# Patient Record
Sex: Female | Born: 1960 | Hispanic: Yes | Smoking: Former smoker
Health system: Southern US, Community
[De-identification: ages and names within clinical notes are randomized; demographics above are authoritative.]

## PROBLEM LIST (undated history)

## (undated) DIAGNOSIS — K219 Gastro-esophageal reflux disease without esophagitis: Secondary | ICD-10-CM

## (undated) DIAGNOSIS — B009 Herpesviral infection, unspecified: Secondary | ICD-10-CM

## (undated) DIAGNOSIS — M1990 Unspecified osteoarthritis, unspecified site: Secondary | ICD-10-CM

## (undated) DIAGNOSIS — K5790 Diverticulosis of intestine, part unspecified, without perforation or abscess without bleeding: Secondary | ICD-10-CM

## (undated) DIAGNOSIS — E039 Hypothyroidism, unspecified: Secondary | ICD-10-CM

## (undated) DIAGNOSIS — K449 Diaphragmatic hernia without obstruction or gangrene: Secondary | ICD-10-CM

## (undated) DIAGNOSIS — M419 Scoliosis, unspecified: Secondary | ICD-10-CM

## (undated) DIAGNOSIS — F419 Anxiety disorder, unspecified: Secondary | ICD-10-CM

## (undated) DIAGNOSIS — G4700 Insomnia, unspecified: Secondary | ICD-10-CM

## (undated) DIAGNOSIS — E079 Disorder of thyroid, unspecified: Secondary | ICD-10-CM

## (undated) HISTORY — DX: Diverticulosis of intestine, part unspecified, without perforation or abscess without bleeding: K57.90

## (undated) HISTORY — PX: CHOLECYSTECTOMY: SHX55

## (undated) HISTORY — DX: Hypothyroidism, unspecified: E03.9

## (undated) HISTORY — PX: COLONOSCOPY: SHX174

## (undated) HISTORY — PX: APPENDECTOMY: SHX54

## (undated) HISTORY — PX: OVARIAN CYST REMOVAL: SHX89

## (undated) HISTORY — DX: Herpesviral infection, unspecified: B00.9

## (undated) HISTORY — PX: GALLBLADDER SURGERY: SHX652

## (undated) HISTORY — PX: TONSILLECTOMY: SUR1361

## (undated) HISTORY — DX: Anxiety disorder, unspecified: F41.9

## (undated) HISTORY — DX: Diaphragmatic hernia without obstruction or gangrene: K44.9

## (undated) HISTORY — DX: Unspecified osteoarthritis, unspecified site: M19.90

## (undated) HISTORY — DX: Insomnia, unspecified: G47.00

## (undated) HISTORY — PX: DILATION AND CURETTAGE OF UTERUS: SHX78

## (undated) HISTORY — DX: Scoliosis, unspecified: M41.9

## (undated) HISTORY — PX: AUGMENTATION MAMMAPLASTY: SUR837

---

## 1999-02-24 ENCOUNTER — Other Ambulatory Visit: Admission: RE | Admit: 1999-02-24 | Discharge: 1999-02-24 | Payer: Self-pay | Admitting: *Deleted

## 1999-03-11 ENCOUNTER — Encounter: Payer: Self-pay | Admitting: Internal Medicine

## 1999-03-11 ENCOUNTER — Encounter: Admission: RE | Admit: 1999-03-11 | Discharge: 1999-03-11 | Payer: Self-pay | Admitting: Internal Medicine

## 1999-07-13 ENCOUNTER — Ambulatory Visit (HOSPITAL_COMMUNITY): Admission: RE | Admit: 1999-07-13 | Discharge: 1999-07-13 | Payer: Self-pay | Admitting: Internal Medicine

## 1999-07-13 ENCOUNTER — Encounter: Payer: Self-pay | Admitting: Internal Medicine

## 1999-07-21 ENCOUNTER — Encounter: Payer: Self-pay | Admitting: Internal Medicine

## 1999-07-21 ENCOUNTER — Ambulatory Visit (HOSPITAL_COMMUNITY): Admission: RE | Admit: 1999-07-21 | Discharge: 1999-07-21 | Payer: Self-pay | Admitting: Internal Medicine

## 1999-11-20 ENCOUNTER — Other Ambulatory Visit: Admission: RE | Admit: 1999-11-20 | Discharge: 1999-11-20 | Payer: Self-pay | Admitting: *Deleted

## 1999-11-20 ENCOUNTER — Encounter (INDEPENDENT_AMBULATORY_CARE_PROVIDER_SITE_OTHER): Payer: Self-pay | Admitting: Specialist

## 2000-03-30 ENCOUNTER — Encounter: Admission: RE | Admit: 2000-03-30 | Discharge: 2000-03-30 | Payer: Self-pay | Admitting: *Deleted

## 2000-03-30 ENCOUNTER — Encounter: Payer: Self-pay | Admitting: *Deleted

## 2000-08-30 ENCOUNTER — Other Ambulatory Visit: Admission: RE | Admit: 2000-08-30 | Discharge: 2000-08-30 | Payer: Self-pay | Admitting: *Deleted

## 2001-01-10 ENCOUNTER — Encounter: Payer: Self-pay | Admitting: Nurse Practitioner

## 2001-05-08 ENCOUNTER — Other Ambulatory Visit: Admission: RE | Admit: 2001-05-08 | Discharge: 2001-05-08 | Payer: Self-pay | Admitting: *Deleted

## 2001-08-25 ENCOUNTER — Encounter: Admission: RE | Admit: 2001-08-25 | Discharge: 2001-08-25 | Payer: Self-pay | Admitting: *Deleted

## 2001-08-25 ENCOUNTER — Encounter: Payer: Self-pay | Admitting: *Deleted

## 2001-09-28 ENCOUNTER — Other Ambulatory Visit: Admission: RE | Admit: 2001-09-28 | Discharge: 2001-09-28 | Payer: Self-pay | Admitting: *Deleted

## 2001-11-07 ENCOUNTER — Encounter: Payer: Self-pay | Admitting: Gastroenterology

## 2001-11-07 ENCOUNTER — Ambulatory Visit (HOSPITAL_COMMUNITY): Admission: RE | Admit: 2001-11-07 | Discharge: 2001-11-07 | Payer: Self-pay | Admitting: Gastroenterology

## 2002-01-08 ENCOUNTER — Encounter: Payer: Self-pay | Admitting: Gastroenterology

## 2002-01-08 ENCOUNTER — Ambulatory Visit (HOSPITAL_COMMUNITY): Admission: RE | Admit: 2002-01-08 | Discharge: 2002-01-08 | Payer: Self-pay | Admitting: Gastroenterology

## 2002-02-23 ENCOUNTER — Encounter: Payer: Self-pay | Admitting: General Surgery

## 2002-02-23 ENCOUNTER — Observation Stay (HOSPITAL_COMMUNITY): Admission: RE | Admit: 2002-02-23 | Discharge: 2002-02-24 | Payer: Self-pay | Admitting: General Surgery

## 2002-02-23 ENCOUNTER — Encounter (INDEPENDENT_AMBULATORY_CARE_PROVIDER_SITE_OTHER): Payer: Self-pay | Admitting: Specialist

## 2002-09-03 ENCOUNTER — Encounter: Payer: Self-pay | Admitting: *Deleted

## 2002-09-03 ENCOUNTER — Encounter: Admission: RE | Admit: 2002-09-03 | Discharge: 2002-09-03 | Payer: Self-pay | Admitting: *Deleted

## 2002-11-07 ENCOUNTER — Other Ambulatory Visit: Admission: RE | Admit: 2002-11-07 | Discharge: 2002-11-07 | Payer: Self-pay | Admitting: *Deleted

## 2003-10-21 ENCOUNTER — Encounter: Admission: RE | Admit: 2003-10-21 | Discharge: 2003-10-21 | Payer: Self-pay | Admitting: Internal Medicine

## 2003-12-02 ENCOUNTER — Ambulatory Visit: Payer: Self-pay | Admitting: Internal Medicine

## 2003-12-27 ENCOUNTER — Ambulatory Visit: Payer: Self-pay | Admitting: Internal Medicine

## 2004-01-14 ENCOUNTER — Other Ambulatory Visit: Admission: RE | Admit: 2004-01-14 | Discharge: 2004-01-14 | Payer: Self-pay | Admitting: *Deleted

## 2004-04-02 ENCOUNTER — Ambulatory Visit: Payer: Self-pay | Admitting: Internal Medicine

## 2004-04-17 ENCOUNTER — Ambulatory Visit: Payer: Self-pay | Admitting: Internal Medicine

## 2004-04-18 ENCOUNTER — Ambulatory Visit: Payer: Self-pay | Admitting: Family Medicine

## 2004-04-20 ENCOUNTER — Ambulatory Visit: Payer: Self-pay | Admitting: Internal Medicine

## 2004-05-13 ENCOUNTER — Ambulatory Visit: Payer: Self-pay | Admitting: Gastroenterology

## 2004-10-07 ENCOUNTER — Emergency Department (HOSPITAL_COMMUNITY): Admission: EM | Admit: 2004-10-07 | Discharge: 2004-10-08 | Payer: Self-pay | Admitting: Emergency Medicine

## 2004-10-22 ENCOUNTER — Ambulatory Visit (HOSPITAL_COMMUNITY): Admission: RE | Admit: 2004-10-22 | Discharge: 2004-10-22 | Payer: Self-pay | Admitting: Internal Medicine

## 2004-11-05 ENCOUNTER — Encounter: Admission: RE | Admit: 2004-11-05 | Discharge: 2004-11-05 | Payer: Self-pay | Admitting: *Deleted

## 2005-01-13 ENCOUNTER — Other Ambulatory Visit: Admission: RE | Admit: 2005-01-13 | Discharge: 2005-01-13 | Payer: Self-pay | Admitting: *Deleted

## 2005-09-03 ENCOUNTER — Ambulatory Visit: Payer: Self-pay | Admitting: Family Medicine

## 2005-09-03 ENCOUNTER — Encounter: Admission: RE | Admit: 2005-09-03 | Discharge: 2005-09-03 | Payer: Self-pay | Admitting: Family Medicine

## 2005-10-07 ENCOUNTER — Encounter: Admission: RE | Admit: 2005-10-07 | Discharge: 2005-10-07 | Payer: Self-pay | Admitting: Internal Medicine

## 2005-11-16 ENCOUNTER — Encounter: Admission: RE | Admit: 2005-11-16 | Discharge: 2005-11-16 | Payer: Self-pay | Admitting: Family Medicine

## 2006-01-20 ENCOUNTER — Other Ambulatory Visit: Admission: RE | Admit: 2006-01-20 | Discharge: 2006-01-20 | Payer: Self-pay | Admitting: *Deleted

## 2006-11-24 ENCOUNTER — Encounter: Admission: RE | Admit: 2006-11-24 | Discharge: 2006-11-24 | Payer: Self-pay | Admitting: Family Medicine

## 2007-01-26 HISTORY — PX: BREAST ENHANCEMENT SURGERY: SHX7

## 2007-01-26 HISTORY — PX: AUGMENTATION MAMMAPLASTY: SUR837

## 2007-01-26 HISTORY — PX: OTHER SURGICAL HISTORY: SHX169

## 2007-05-02 ENCOUNTER — Other Ambulatory Visit: Admission: RE | Admit: 2007-05-02 | Discharge: 2007-05-02 | Payer: Self-pay | Admitting: *Deleted

## 2007-06-08 ENCOUNTER — Ambulatory Visit: Payer: Self-pay | Admitting: Internal Medicine

## 2008-01-17 ENCOUNTER — Ambulatory Visit: Payer: Self-pay | Admitting: Gynecology

## 2008-01-17 ENCOUNTER — Other Ambulatory Visit: Admission: RE | Admit: 2008-01-17 | Discharge: 2008-01-17 | Payer: Self-pay | Admitting: Gynecology

## 2008-04-10 ENCOUNTER — Ambulatory Visit: Payer: Self-pay | Admitting: Gynecology

## 2008-04-17 ENCOUNTER — Ambulatory Visit: Payer: Self-pay | Admitting: Gynecology

## 2008-06-26 ENCOUNTER — Ambulatory Visit: Payer: Self-pay | Admitting: Gynecology

## 2008-09-12 ENCOUNTER — Ambulatory Visit: Payer: Self-pay | Admitting: Gynecology

## 2008-11-06 ENCOUNTER — Ambulatory Visit: Payer: Self-pay | Admitting: Gynecology

## 2008-11-13 ENCOUNTER — Encounter: Admission: RE | Admit: 2008-11-13 | Discharge: 2008-11-13 | Payer: Self-pay | Admitting: Gynecology

## 2009-01-05 ENCOUNTER — Ambulatory Visit: Admission: AD | Admit: 2009-01-05 | Discharge: 2009-01-05 | Payer: Self-pay | Admitting: Family Medicine

## 2009-01-28 ENCOUNTER — Other Ambulatory Visit: Admission: RE | Admit: 2009-01-28 | Discharge: 2009-01-28 | Payer: Self-pay | Admitting: Gynecology

## 2009-01-28 ENCOUNTER — Ambulatory Visit: Payer: Self-pay | Admitting: Gynecology

## 2009-12-17 ENCOUNTER — Ambulatory Visit: Payer: Self-pay | Admitting: Gynecology

## 2010-01-02 ENCOUNTER — Encounter
Admission: RE | Admit: 2010-01-02 | Discharge: 2010-01-02 | Payer: Self-pay | Source: Home / Self Care | Attending: Gynecology | Admitting: Gynecology

## 2010-01-16 ENCOUNTER — Encounter (INDEPENDENT_AMBULATORY_CARE_PROVIDER_SITE_OTHER): Payer: Self-pay | Admitting: *Deleted

## 2010-02-10 ENCOUNTER — Other Ambulatory Visit: Payer: Self-pay | Admitting: Nurse Practitioner

## 2010-02-10 ENCOUNTER — Ambulatory Visit
Admission: RE | Admit: 2010-02-10 | Discharge: 2010-02-10 | Payer: Self-pay | Source: Home / Self Care | Attending: Internal Medicine | Admitting: Internal Medicine

## 2010-02-10 ENCOUNTER — Telehealth: Payer: Self-pay | Admitting: Gastroenterology

## 2010-02-10 DIAGNOSIS — R112 Nausea with vomiting, unspecified: Secondary | ICD-10-CM | POA: Insufficient documentation

## 2010-02-10 DIAGNOSIS — R142 Eructation: Secondary | ICD-10-CM

## 2010-02-10 DIAGNOSIS — R1013 Epigastric pain: Secondary | ICD-10-CM | POA: Insufficient documentation

## 2010-02-10 DIAGNOSIS — K219 Gastro-esophageal reflux disease without esophagitis: Secondary | ICD-10-CM | POA: Insufficient documentation

## 2010-02-10 DIAGNOSIS — R141 Gas pain: Secondary | ICD-10-CM | POA: Insufficient documentation

## 2010-02-10 DIAGNOSIS — R197 Diarrhea, unspecified: Secondary | ICD-10-CM | POA: Insufficient documentation

## 2010-02-10 DIAGNOSIS — R143 Flatulence: Secondary | ICD-10-CM

## 2010-02-10 DIAGNOSIS — R1084 Generalized abdominal pain: Secondary | ICD-10-CM | POA: Insufficient documentation

## 2010-02-10 LAB — COMPREHENSIVE METABOLIC PANEL
ALT: 17 U/L (ref 0–35)
AST: 19 U/L (ref 0–37)
Albumin: 3.5 g/dL (ref 3.5–5.2)
Alkaline Phosphatase: 50 U/L (ref 39–117)
BUN: 10 mg/dL (ref 6–23)
CO2: 28 mEq/L (ref 19–32)
Calcium: 8.7 mg/dL (ref 8.4–10.5)
Chloride: 103 mEq/L (ref 96–112)
Creatinine, Ser: 0.8 mg/dL (ref 0.4–1.2)
GFR: 85.73 mL/min (ref >60.00)
Glucose, Bld: 86 mg/dL (ref 70–99)
Potassium: 4.3 mEq/L (ref 3.5–5.1)
Sodium: 138 mEq/L (ref 135–145)
Total Bilirubin: 0.4 mg/dL (ref 0.3–1.2)
Total Protein: 6.4 g/dL (ref 6.0–8.3)

## 2010-02-10 LAB — CBC WITH DIFFERENTIAL/PLATELET
Basophils Absolute: 0 10*3/uL (ref 0.0–0.1)
Basophils Relative: 0.4 % (ref 0.0–3.0)
Eosinophils Absolute: 0.1 10*3/uL (ref 0.0–0.7)
Eosinophils Relative: 1.8 % (ref 0.0–5.0)
HCT: 37.7 % (ref 36.0–46.0)
Hemoglobin: 12.9 g/dL (ref 12.0–15.0)
Lymphocytes Relative: 30.8 % (ref 12.0–46.0)
Lymphs Abs: 1.8 10*3/uL (ref 0.7–4.0)
MCHC: 34.2 g/dL (ref 30.0–36.0)
MCV: 95.4 fl (ref 78.0–100.0)
Monocytes Absolute: 0.4 10*3/uL (ref 0.1–1.0)
Monocytes Relative: 6.5 % (ref 3.0–12.0)
Neutro Abs: 3.4 10*3/uL (ref 1.4–7.7)
Neutrophils Relative %: 60.5 % (ref 43.0–77.0)
Platelets: 351 10*3/uL (ref 150.0–400.0)
RBC: 3.95 Mil/uL (ref 3.87–5.11)
RDW: 13.9 % (ref 11.5–14.6)
WBC: 5.7 10*3/uL (ref 4.5–10.5)

## 2010-02-10 LAB — LIPASE: Lipase: 16 U/L (ref 11.0–59.0)

## 2010-02-12 ENCOUNTER — Encounter: Payer: Self-pay | Admitting: Nurse Practitioner

## 2010-02-14 ENCOUNTER — Encounter: Payer: Self-pay | Admitting: Gynecology

## 2010-02-14 ENCOUNTER — Encounter: Payer: Self-pay | Admitting: Internal Medicine

## 2010-02-26 NOTE — Assessment & Plan Note (Addendum)
Summary: np/ abdominal pain, diarrhea x 2 days   History of Present Illness Visit Type: Initial Visit Primary GI MD: Stan Head MD [facility] Primary Provider: Leana Roe Chief Complaint: abdominal pain History of Present Illness:   Patient was last seen in 2002 for a colonoscopy by Dr. Corinda Gubler which was done for hematochezia and family history of colon cancer.   Since having cholectystectomy several months ago patient has had excessive gas, noisy stomach and intermittent loose stool. She has an even longer history ot heartburn. She takes Tums several times a week.   Yesterday and today has had diffuse abdominal pain, nausea, vomiting and diarrhea. While she has loose stools sometimes, this has been non-stop. Pain woke her up last night. Defecation doesn't consistently improve the pain. No fever. No rectal bleeding. 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 Held down crackers and water today. Taking Pepto Bismul.  Drinks ETOH on weekends, three or more drinks each day. Pain started Sunday after she had drank ETOH on Saturday.    GI Review of Systems    Reports abdominal pain, acid reflux, bloating, heartburn, nausea, and  vomiting.      Denies belching, chest pain, dysphagia with liquids, dysphagia with solids, loss of appetite, vomiting blood, weight loss, and  weight gain.      Reports change in bowel habits, diarrhea, and  fecal incontinence.     Denies anal fissure, black tarry stools, constipation, diverticulosis, heme positive stool, hemorrhoids, irritable bowel syndrome, jaundice, light color stool, liver problems, rectal bleeding, and  rectal pain.    Current Medications (verified): 1)  Lorazepam 2 Mg Tabs (Lorazepam) .... 1 By Mouth At Bedtime 2)  Gas Relief 80 Mg Chew (Simethicone) .... As Needed 3)  Pepto-Bismol 262 Mg Chew (Bismuth Subsalicylate) .... As Needed  Allergies (verified): 1)  ! Sulfa 2)  !  Codeine  Past History:  Past Medical History: Anxiety Disorder  Past Surgical History: breast implants tummy tuck liposuction Appendectomy Cholecystectomy  Family History: Family History of Breast Cancer: Family History of Colon Polyps: Family History of Stomach Cancer:  Social History: divorced montessori teacher Alcohol Use - yes 6 PER DAY Daily Caffeine Use 2 children  Review of Systems       The patient complains of anxiety-new, night sweats, and urine leakage.  The patient denies allergy/sinus, anemia, arthritis/joint pain, back pain, blood in urine, breast changes/lumps, change in vision, confusion, cough, coughing up blood, depression-new, fainting, fatigue, fever, headaches-new, hearing problems, heart murmur, heart rhythm changes, itching, menstrual pain, muscle pains/cramps, nosebleeds, pregnancy symptoms, shortness of breath, skin rash, sleeping problems, sore throat, swelling of feet/legs, swollen lymph glands, thirst - excessive , urination - excessive , urination changes/pain, vision changes, and voice change.    Vital Signs:  Patient profile:   49 year old female Height:      63 inches Weight:      126 pounds BMI:     22 .40 BSA:     1.59 Pulse rate:   80 / minute Pulse rhythm:   regular BP sitting:   102 / 72  (left arm)  Vitals Entered By: Merri Ray CMA Duncan Dull) (February 10, 2010 1:38 PM)  Physical Exam  General:  Well developed, well nourished, no acute distress. Head:  Normocephalic and atraumatic. Eyes:  Conjunctiva pink, no icterus.  Neck:  no obvious masses  Lungs:  Clear throughout to auscultation. Heart:  Regular  rate and rhythm; no murmurs, rubs,  or bruits. Abdomen:  Abdomen soft, nondistended. Mild-moderate mid upper abdominal tenderness.  No obvious masses or hepatomegaly.Normal bowel sounds.  Msk:  Symmetrical with no gross deformities. Normal posture. Extremities:  No clubbing, cyanosis, edema or deformities noted. Neurologic:   Alert and  oriented x4;  grossly normal neurologically. Skin:  Intact without significant lesions or rashes. Cervical Nodes:  No significant cervical adenopathy. Psych:  Alert and cooperative. Normal mood and affect.   Impression & Recommendations:  Problem # 1:  ABDOMINAL PAIN -GENERALIZED (ICD-789.07) Assessment New Diffuse abdominal pain, nausea, vomiting (non-bloody), and diarrhea for almost 48 hours. This could be an infectious process. All of her symptoms don't fit perfectly but biliary or pancreatic disease is possible.  She has mild-moderate epigastric tenderness on exam. Will obtain basic labs and stool studies.  Patient will follow up with Dr. Leone Payor in couple of weeks.  In the meantime patient will be called with test results, and for condition update. Further recommendations will then follow.   Problem # 2:  GERD (ICD-530.81) Start daily PPI, anti-reflux literature given.   Problem # 3:  FLATULENCE-GAS-BLOATING (ICD-787.3) Assessment: Unchanged Chronic gas and intermittent loose stool and "noisy stomach" since cholecystectomy 7 months ago. Symptoms likely functional but will evaluate further once acute symptoms resolve.   Other Orders: T-C diff by PCR (14782) T-Culture, Stool ([id number]) T-Fecal WBC ([id number]) TLB-CBC Platelet - w/Differential (85025-CBCD) TLB-CMP (Comprehensive Metabolic Pnl) (80053-COMP) TLB-Lipase (83690-LIPASE)  Patient Instructions: 1)  Please go to lab, basement level. 2)  We have given you samples of Dexilant 60 mg, take 1 tab 30 min prior to breakfast.  We sent a perscription for you to your pharmacy. 3)  We have given you GERD literature, acid reflux information. 4)  Make an appointment to see Dr. Stan Head in 2-3 weeks. 5)  Copy sent to : Dr. Charlesetta Shanks 6)  The medication list was reviewed and reconciled.  All changed / newly prescribed medications were explained.  A complete medication list was provided to the patient /  caregiver. Prescriptions: PROMETHAZINE HCL 12.5 MG TABS (PROMETHAZINE HCL) Take 1 tab every 4 hours as needed for nausea  May make drowsey  #45 x 0   Entered by:   Lowry Ram NCMA   Authorized by:   Willette Cluster NP   Signed by:   Lowry Ram NCMA on 02/10/2010   Method used:   [facility] to        [facility]  [phone]retail[facility]       [address] [place], [place]  [address]       Ph: [phone] or [phone]       Fax: [phone]   RxID:   [id number]   Appended Document: np/ abdominal pain, diarrhea x 2 days Thanks. If still is much better then she can cancel her follow up and call us as needed. Probably had infectious process which has resolved. Thanks  Appended Document: np/ abdominal pain, diarrhea x 2 days Message left for patient to call back. Jesse Fall, RN 02/16/10 10:04 AM  Appended Document: np/ abdominal pain, diarrhea x 2 days Spoke with patient. She wants to keep the appointment because her "stomach makes strange noises and she has to go to the bathroom a lot for bowel movements.

## 2010-02-26 NOTE — Progress Notes (Signed)
Summary: Triage  Phone Note Call from Patient Call back at [phone]   Caller: Patient Call For: Dr. Jarold Motto Reason for Call: Talk to Nurse Summary of Call: Pt has had diarrhea and throwing up wtih a lot of pain in her stomach, started yesterday and lasted through the night, wants to be worked in today, has NP appt with Jarold Motto on 2/2 Initial call taken by: [place] Johnson,  February 10, 2010 8:05 AM  Follow-up for Phone Call        Old Dr Victorino Dike patient not seen in more than 4 years. Patient stated she knows she had a colon by Dr Doreatha Martin, doesn't remember endo. Yesterday, had diarrhea and a lot of abdominal pain; vomited 1 time. Today, same abdominal pain and diarrhea. Requested old chart. Graciella Freer RN  February 10, 2010 8:59 AM   Scheduled patient to come in to see Willette Cluster NP today. Cancelled appointment with Dr Jarold Motto for 02/26/10. Follow-up by: Graciella Freer RN,  February 10, 2010 10:19 AM

## 2010-02-26 NOTE — Procedures (Signed)
Summary: Colonoscopy/[facility]  Colonoscopy/[facility]   Imported By: Sherian Rein 02/16/2010 09:41:01  _____________________________________________________________________  External Attachment:    Type:   Image     Comment:   External Document

## 2010-02-26 NOTE — Letter (Signed)
Summary: New Patient letter  [facility]  [address]   [place], [place] [address]   Phone: [phone]  Fax: [phone]       01/16/2010 MRN: [id number]  Sarah Zhang [address] [place], [place]  [address]  Dear Ms. Sarah Zhang,  Welcome to the Gastroenterology Division at [facility].    You are scheduled to see Dr.  Jarold Motto on 02/26/2010 at 8:30 am on the 3rd floor at [facility], [address]  We ask that you try to arrive at our office 15 minutes prior to your appointment time to allow for check-in.  We would like you to complete the enclosed self-administered evaluation form prior to your visit and bring it with you on the day of your appointment.  We will review it with you.  Also, please bring a complete list of all your medications or, if you prefer, bring the medication bottles and we will list them.  Please bring your insurance card so that we may make a copy of it.  If your insurance requires a referral to see a specialist, please bring your referral form from your primary care physician.  Co-payments are due at the time of your visit and may be paid by cash, check or credit card.     Your office visit will consist of a consult with your physician (includes a physical exam), any laboratory testing he/she may order, scheduling of any necessary diagnostic testing (e.g. x-ray, ultrasound, CT-scan), and scheduling of a procedure (e.g. Endoscopy, Colonoscopy) if required.  Please allow enough time on your schedule to allow for any/all of these possibilities.    If you cannot keep your appointment, please call [phone] to cancel or reschedule prior to your appointment date.  This allows [place] the opportunity to schedule an appointment for another patient in need of care.  If you do not cancel or reschedule by 5 p.m. the business day prior to your appointment date, you will be charged a $50.00 late cancellation/no-show fee.    Thank you for choosing  [facility] for your medical needs.  We appreciate the opportunity to care for you.  Please visit [place] at our website  to learn more about our practice.                     Sincerely,                                                             The Gastroenterology Division

## 2010-03-04 ENCOUNTER — Telehealth: Payer: Self-pay | Admitting: Internal Medicine

## 2010-03-06 ENCOUNTER — Encounter: Payer: Self-pay | Admitting: Internal Medicine

## 2010-03-06 ENCOUNTER — Ambulatory Visit (INDEPENDENT_AMBULATORY_CARE_PROVIDER_SITE_OTHER): Payer: 59 | Admitting: Internal Medicine

## 2010-03-06 DIAGNOSIS — R198 Other specified symptoms and signs involving the digestive system and abdomen: Secondary | ICD-10-CM

## 2010-03-06 DIAGNOSIS — R141 Gas pain: Secondary | ICD-10-CM

## 2010-03-06 DIAGNOSIS — R1013 Epigastric pain: Secondary | ICD-10-CM

## 2010-03-06 DIAGNOSIS — J328 Other chronic sinusitis: Secondary | ICD-10-CM | POA: Insufficient documentation

## 2010-03-12 ENCOUNTER — Telehealth: Payer: Self-pay | Admitting: Internal Medicine

## 2010-03-12 NOTE — Letter (Signed)
Summary: [facility] Instructions  [facility]  [address] [place], [place] [address]   Phone: [phone]  Fax: [phone]       [place] Lonna Cobb    05/25/1970    MRN: [id number]        Procedure Day Dorna Bloom: Wednesday 03/18/10     Arrival Time: 1:30 pm     Procedure Time: 2:30 pm     Location of Procedure:                    _x _  [facility] (4th Floor)  PREPARATION FOR COLONOSCOPY WITH MOVIPREP   Starting 5 days prior to your procedure 03/14/10 do not eat nuts, seeds, popcorn, corn, beans, peas,  salads, or any raw vegetables.  Do not take any fiber supplements (e.g. Metamucil, Citrucel, and Benefiber).  THE DAY BEFORE YOUR PROCEDURE         DATE: 03/17/10  DAY: Tuesday  1.  Drink clear liquids the entire day-NO SOLID FOOD  2.  Do not drink anything colored red or purple.  Avoid juices with pulp.  No orange juice.  3.  Drink at least 64 oz. (8 glasses) of fluid/clear liquids during the day to prevent dehydration and help the prep work efficiently.  CLEAR LIQUIDS INCLUDE: Water Jello Ice Popsicles Tea (sugar ok, no milk/cream) Powdered fruit flavored drinks Coffee (sugar ok, no milk/cream) Gatorade Juice: apple, white grape, white cranberry  Lemonade Clear bullion, consomm, broth Carbonated beverages (any kind) Strained chicken noodle soup Hard Candy                             4.  In the morning, mix first dose of MoviPrep solution:    Empty 1 Pouch A and 1 Pouch B into the disposable container    Add lukewarm drinking water to the top line of the container. Mix to dissolve    Refrigerate (mixed solution should be used within 24 hrs)  5.  Begin drinking the prep at 5:00 p.m. The MoviPrep container is divided by 4 marks.   Every 15 minutes drink the solution down to the next mark (approximately 8 oz) until the full liter is complete.   6.  Follow completed prep with 16 oz of clear liquid of your choice (Nothing red or purple).   Continue to drink clear liquids until bedtime.  7.  Before going to bed, mix second dose of MoviPrep solution:    Empty 1 Pouch A and 1 Pouch B into the disposable container    Add lukewarm drinking water to the top line of the container. Mix to dissolve    Refrigerate  THE DAY OF YOUR PROCEDURE      DATE: 03/18/10 DAY: Wednesday  Beginning at 9:30 a.m. (5 hours before procedure):         1. Every 15 minutes, drink the solution down to the next mark (approx 8 oz) until the full liter is complete.  2. Follow completed prep with 16 oz. of clear liquid of your choice.    3. You may drink clear liquids until 12:30 pm (2 HOURS BEFORE PROCEDURE).   MEDICATION INSTRUCTIONS  Unless otherwise instructed, you should take regular prescription medications with a small sip of water   as early as possible the morning of your procedure.        OTHER INSTRUCTIONS  You will need a responsible adult at least 50 years  of age to accompany you and drive you home.   This person must remain in the waiting room during your procedure.  Wear loose fitting clothing that is easily removed.  Leave jewelry and other valuables at home.  However, you may wish to bring a book to read or  an iPod/MP3 player to listen to music as you wait for your procedure to start.  Remove all body piercing jewelry and leave at home.  Total time from sign-in until discharge is approximately 2-3 hours.  You should go home directly after your procedure and rest.  You can resume normal activities the  day after your procedure.  The day of your procedure you should not:   Drive   Make legal decisions   Operate machinery   Drink alcohol   Return to work  You will receive specific instructions about eating, activities and medications before you leave.    The above instructions have been reviewed and explained to me by   _______________________    I fully understand and can verbalize these instructions  _____________________________ Date _________

## 2010-03-12 NOTE — Assessment & Plan Note (Signed)
History of Present Illness Visit Type: Follow-up Visit Primary GI MD: Stan Head MD [facility] Primary Provider: Leana Roe Chief Complaint: Abdominal pain History of Present Illness:   50 yo woman  complains that her abdominal pain started on 2/8. She states that the Dexilant is helping when she gets extreme pain she took an extra Dexilant. She complains of alot of sounds coming from her stomch. She also complains of an intermitent twisting feeling in her abdomen.  Seen in January with diarrhea and nausea and vomiting which is better. Stool studies were negative. she was sick for 2 days. Then better and ok for a few days. hen she notied problems after black beans at lunch wit LUQ pain.  ++++ borborygmi now and that is creating problems for her in social situations  She had post-cholecystectomy diarrhea and took some medication after her surgey and then she stopped that. she was well without significant issues until about 1 year ago when she began to have GI problems with loose and urgent defecation and increasing borborygmi.  Frequently when  she urinates she notices that se is also producing stool. No pain with this. Stools are always formed.      GI Review of Systems    Reports abdominal pain.     Location of  Abdominal pain: LUQ.    Denies acid reflux, belching, bloating, chest pain, dysphagia with liquids, dysphagia with solids, heartburn, loss of appetite, nausea, vomiting, vomiting blood, weight loss, and  weight gain.        Denies anal fissure, black tarry stools, change in bowel habit, constipation, diarrhea, diverticulosis, fecal incontinence, heme positive stool, hemorrhoids, irritable bowel syndrome, jaundice, light color stool, liver problems, rectal bleeding, and  rectal pain. Preventive Screening-Counseling & Management      Drug Use:  no.      Current Medications (verified): 1)  Lorazepam 2 Mg Tabs (Lorazepam) .Marland Kitchen.. 1 By Mouth At Bedtime 2)  Gas Relief 80 Mg  Chew (Simethicone) .... As Needed 3)  Pepto-Bismol 262 Mg Chew (Bismuth Subsalicylate) .... As Needed 4)  Dexilant 60 Mg Cpdr (Dexlansoprazole) .... Take 1 Capsule 30 Min Prior To Breakfast  Allergies (verified): 1)  ! Sulfa 2)  ! Codeine  Past History:  Past Medical History: Reviewed history from 02/10/2010 and no changes required. Anxiety Disorder  Past Surgical History: breast implants tummy tuck liposuction Appendectomy Cholecystectomy Tonsillectomy  Family History: Family History of Breast Cancer: paternal grandmother Family History of Colon Polyps: paternal uncle Family History of Stomach Cancer:paternal uncle and paternal great uncle Family History of Colon Cancer:paternal uncle??  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 Lungs:  Clear throughout to auscultation. Heart:  Regular rate and rhythm; no murmurs, rubs,  or bruits. Abdomen:  Abdomen soft, nondistended. Mild-moderate mid upper abdominal tenderness.  No obvious masses or hepatomegaly.Normal bowel sounds.  Psych:  anxious-appearing (mildly)   Impression & Recommendations:  Problem # 1:  ABDOMINAL PAIN-EPIGASTRIC (ICD-789.06) Seems like some of this was GERD and she is better on PPI. Given overall situation with bowel changes I have some ? about possible IBD. More likely functional issues exacerbated by a gastrointestinal ifection in January. I think there  is value in endoscopy for reassurance, at least.  May need some antispasmodics. Risks, benefits,and indications of endoscopic procedure(s) were reviewed with the patient and all questions answered.  Orders: Colon/Endo (Colon/Endo)  Problem # 2:  CHANGE IN BOWELS (ZOX-096.04) Most likely a post-infectious functional eacerbation. i am suspicious of long-standing IBS with flare of problems after GI infection. At her age (almost 26) and with the symptoms and concerns, colonoscopy to look for IBD and to reassure is reasonable and she will then hopefully not need another one for 10 years unless we find something. Risks, benefits,and indications of endoscopic procedure(s) were reviewed with the patient and all questions answered.  Orders: Colon/Endo (Colon/Endo)  Problem # 3:  FLATULENCE-GAS-BLOATING (ICD-787.3) Assessment: Deteriorated Borborygmi and gas issues. Mostly borborygmi. After endoscopic evaluation will consider probiotics vs. antibiotics depending upon what we see.  Patient Instructions: 1)  You have been scheduled for an endoscopy and colonoscopy. Please follow written instructions that were given to you at your office visit today. 2)  We have given you samples of Dexilant to take 1 capsule by mouth once daily. 3)  The medication list was reviewed and reconciled.  All changed / newly prescribed medications were explained.  A complete medication list was provided to the patient / caregiver. Prescriptions: MOVIPREP 100 GM  SOLR (PEG-KCL-NACL-NASULF-NA ASC-C) As per prep instructions.  #1 x 0   Entered by:   Lamona Curl CMA (AAMA)   Authorized by:   Iva Boop MD, [facility]   Signed by:   Lamona Curl CMA (AAMA) on 03/06/2010   Method used:   [facility]        [facility]  [phone][facility] (retail)       [address] [place], [place]  [address]       Ph: [phone] or [phone]       Fax: [phone]   RxID:   [id number]  cc: Charlesetta Shanks,  MD

## 2010-03-12 NOTE — Progress Notes (Signed)
Summary: triage  Phone Note Call from Patient Call back at work [phone]   Caller: Patient Call For: Dr Leone Payor Reason for Call: Talk to Nurse Summary of Call: Patient is scheduled to see Dr Leone Payor on Friday but states that she was unable to sleep last night because she was having a lot of stomach pain, wants to know what to do. Initial call taken by: Tawni Levy,  March 04, 2010 9:46 AM  Follow-up for Phone Call        Patient is c/o abdominal pain mostly on an empty stomach.  She states the pain is waking her up at night.  She states now that she is starting to get the pain again.  She says it helped when she took a dexilant at 4:00 am.  I have asked her to add Pepcid OTC until her appointment with Dr Leone Payor on Friday.   Follow-up by: Darcey Nora RN, CGRN,  March 04, 2010 10:08 AM

## 2010-03-13 ENCOUNTER — Ambulatory Visit (INDEPENDENT_AMBULATORY_CARE_PROVIDER_SITE_OTHER): Payer: 59 | Admitting: Gynecology

## 2010-03-13 DIAGNOSIS — N898 Other specified noninflammatory disorders of vagina: Secondary | ICD-10-CM

## 2010-03-13 DIAGNOSIS — B373 Candidiasis of vulva and vagina: Secondary | ICD-10-CM

## 2010-03-18 ENCOUNTER — Encounter (AMBULATORY_SURGERY_CENTER): Payer: 59 | Admitting: Internal Medicine

## 2010-03-18 ENCOUNTER — Encounter: Payer: Self-pay | Admitting: Internal Medicine

## 2010-03-18 ENCOUNTER — Other Ambulatory Visit: Payer: Self-pay | Admitting: Internal Medicine

## 2010-03-18 DIAGNOSIS — R933 Abnormal findings on diagnostic imaging of other parts of digestive tract: Secondary | ICD-10-CM

## 2010-03-18 DIAGNOSIS — K5730 Diverticulosis of large intestine without perforation or abscess without bleeding: Secondary | ICD-10-CM

## 2010-03-18 DIAGNOSIS — R109 Unspecified abdominal pain: Secondary | ICD-10-CM

## 2010-03-18 DIAGNOSIS — R198 Other specified symptoms and signs involving the digestive system and abdomen: Secondary | ICD-10-CM

## 2010-03-18 DIAGNOSIS — R1013 Epigastric pain: Secondary | ICD-10-CM

## 2010-03-18 NOTE — Progress Notes (Signed)
Summary: Medication  Phone Note Call from Patient Call back at Home Phone [phone]   Caller: Patient Call For: Dr. Leone Payor Reason for Call: Talk to Nurse Summary of Call: Patient has questions about what medication she can take today, she has a fever blister and wants to take Valacyclovir  Initial call taken by: [place] Johnson,  March 12, 2010 9:57 AM  Follow-up for Phone Call        patient advised ok to take her fever blister meds. Follow-up by: Darcey Nora RN, CGRN,  March 12, 2010 10:10 AM

## 2010-03-20 ENCOUNTER — Telehealth: Payer: Self-pay | Admitting: Internal Medicine

## 2010-03-24 ENCOUNTER — Telehealth: Payer: Self-pay | Admitting: Internal Medicine

## 2010-03-24 NOTE — Procedures (Addendum)
Summary: Upper Endoscopy  Patient: [name] Note: All result statuses are Final unless otherwise noted.  Tests: (1) Upper Endoscopy (EGD)   EGD Upper Endoscopy       DONE     [facility]     [address]     [place], [place]  [address]          ENDOSCOPY PROCEDURE REPORT          PATIENT:  Sarah Zhang, Sarah Zhang  MR#:  [id number]     BIRTHDATE:  [birth year], 49 yrs. old  GENDER:  female          ENDOSCOPIST:  Iva Boop, MD, [facility]     Referred by:  Charlesetta Shanks, M.D.          PROCEDURE DATE:  03/18/2010     PROCEDURE:  EGD with biopsy, [address]     ASA CLASS:  Class I     INDICATIONS:  epigastric pain          MEDICATIONS:   Fentanyl 50 mcg IV, Versed 5 mg IV, Benadryl 12.5     mg IV     TOPICAL ANESTHETIC:  Exactacain Spray          DESCRIPTION OF PROCEDURE:   After the risks benefits and     alternatives of the procedure were thoroughly explained, informed     consent was obtained.  The LB GIF-H180 [id number] endoscope was     introduced through the mouth and advanced to the second portion of     the duodenum, without limitations.  The instrument was slowly     withdrawn as the mucosa was fully examined.     <<PROCEDUREIMAGES>>          Abnormal appearing mucosa in the second portion of the duodenum. ?     scalloped folds Multiple biopsies were obtained and sent to     pathology.  Otherwise the examination was normal.    Retroflexed     views revealed no abnormalities.    The scope was then withdrawn     from the patient and the procedure completed.          COMPLICATIONS:  None          ENDOSCOPIC IMPRESSION:     1) Abnormal mucosa in the second portion duodenum - ? scalloped     folds indicating celiac disease - biopsied     2) Otherwise normal examination     RECOMMENDATIONS:     1) start dicyclomine 20 mg 1/2-1 tab every 6 hours as needed for     abdominal pain - prescription sent     2) await pathology     3) colonoscopy is next          Iva Boop, MD, [facility]          CC:  Charlesetta Shanks, MD     The Patient          n.     Rosalie Doctor:   Iva Boop at 03/18/2010 03:50 PM          Alcide Clever, [phone]  Note: An exclamation mark (!) indicates a result that was not dispersed into the flowsheet. Document Creation Date: 03/18/2010 3:50 PM _______________________________________________________________________  (1) Order result status: Final Collection or observation date-time: 03/18/2010 15:24 Requested date-time:  Receipt date-time:  Reported date-time:  Referring Physician:   Ordering Physician: Stan Head [phone]) Specimen Source:  Source: Launa Grill Order  Number: [id number] Lab site:

## 2010-03-24 NOTE — Procedures (Addendum)
Summary: Colonoscopy  Patient: [name] Note: All result statuses are Final unless otherwise noted.  Tests: (1) Colonoscopy (COL)   COL Colonoscopy           DONE     [facility]     [address]     [place], [place]  [address]          COLONOSCOPY PROCEDURE REPORT          PATIENT:  Sarah, Zhang  MR#:  [id number]     BIRTHDATE:  1960-04-24, 49 yrs. old  GENDER:  female     ENDOSCOPIST:  Iva Boop, MD, [facility]     REF. BY:  Charlesetta Shanks, M.D.     PROCEDURE DATE:  03/18/2010     PROCEDURE:  Colonoscopy [address]     ASA CLASS:  Class I     INDICATIONS:  change in bowel habits     MEDICATIONS:   There was residual sedation effect present from     prior procedure., Versed 3 mg IV, Fentanyl 50 mcg IV          DESCRIPTION OF PROCEDURE:   After the risks benefits and     alternatives of the procedure were thoroughly explained, informed     consent was obtained.  Digital rectal exam was performed and     revealed no abnormalities.   The LB PCF-Q180AL [id number] endoscope     was introduced through the anus and advanced to the terminal ileum     which was intubated for a short distance, without limitations.     The quality of the prep was excellent, using MoviPrep.  The     instrument was then slowly withdrawn as the colon was fully     examined. Insertion: 5:27 minutes Withdrawal: 10:29 minutes     <<PROCEDUREIMAGES>>          FINDINGS:  Mild diverticulosis was found in the left colon.  This     was otherwise a normal examination of the colon. Includes terminal     ileum and right colon retroflexion exams.   Retroflexed views in     the rectum revealed no abnormalities.    The scope was then     withdrawn from the patient and the procedure completed.          COMPLICATIONS:  None     ENDOSCOPIC IMPRESSION:     1) Mild diverticulosis in the left colon     2) Otherwise normal examination     RECOMMENDATIONS:     1) To start dicyclomine as per EGD report.     2)  Once duodenal biopsies return will notify and consider other     therapy if needed     REPEAT EXAM:  In 10 year(s) for routine screening colonoscopy.          Iva Boop, MD, [facility]          CC:  Charlesetta Shanks, MD and The Patient          n.     eSIGNED:   Iva Boop at 03/18/2010 03:55 PM          Alcide Clever, [phone]  Note: An exclamation mark (!) indicates a result that was not dispersed into the flowsheet. Document Creation Date: 03/18/2010 3:56 PM _______________________________________________________________________  (1) Order result status: Final Collection or observation date-time: 03/18/2010 15:45 Requested date-time:  Receipt date-time:  Reported date-time:  Referring Physician:  Ordering Physician: Stan Head [phone]) Specimen Source:  Source: Launa Grill Order Number: [id number] Lab site:

## 2010-03-24 NOTE — Progress Notes (Signed)
Summary: refill of Dexilant  ---- Converted from flag ---- ---- 03/20/2010 12:04 PM, Karna Christmas wrote: Needs her Dexilant resent to pharmacy  # [phone] ------------------------------  Phone Note Call from Patient   Caller: Patient Summary of Call: Dexilant refill    Prescriptions: DEXILANT 60 MG CPDR (DEXLANSOPRAZOLE) Take 1 capsule 30 min prior to breakfast  #30 x 11   Entered by:   Milford Cage NCMA   Authorized by:   Iva Boop MD, [facility]   Signed by:   Milford Cage NCMA on 03/20/2010   Method used:   [facility] to        [facility]  [phone]* (retail)       [address] [place], [place]  [address]       Ph: [phone] or [phone]       Fax: [phone]   RxID:   [id number]

## 2010-03-24 NOTE — Miscellaneous (Signed)
Summary: dicyclomine rx  Clinical Lists Changes  Medications: Removed medication of MOVIPREP 100 GM  SOLR (PEG-KCL-NACL-NASULF-NA ASC-C) As per prep instructions. Added new medication of DICYCLOMINE HCL 20 MG TABS (DICYCLOMINE HCL) 1/2-1 tablet by mouth every 6 hours as needed for abdominal pain - Signed Rx of DICYCLOMINE HCL 20 MG TABS (DICYCLOMINE HCL) 1/2-1 tablet by mouth every 6 hours as needed for abdominal pain;  #90 x 0;  Signed;  Entered by: Iva Boop MD, [facility];  Authorized by: Iva Boop MD, [facility];  Method used: [facility]  [phone]*, [address], [place], [place]  [address], Ph: [phone] or [phone], Fax: [phone]    Prescriptions: DICYCLOMINE HCL 20 MG TABS (DICYCLOMINE HCL) 1/2-1 tablet by mouth every 6 hours as needed for abdominal pain  #90 x 0   Entered and Authorized by:   Iva Boop MD, [facility]   Signed by:   Iva Boop MD, [facility] on 03/18/2010   Method used:   Electronically to        [facility]  [phone]* (retail)       [address] [place], [place]  [address]       Ph: [phone] or [phone]       Fax: [phone]   RxID:   [id number]

## 2010-04-02 NOTE — Progress Notes (Signed)
Summary: duod bxs neg, take align  Phone Note Outgoing Call   Summary of Call: let her know duodenal biopsies were normal I suspect main issues are GERD and IBS she should take Align 1 each day for a month and continue the dicyclomine as prescribed. She was given a refill for Dexilant for 1 year but I am not convinced she will need that long-term (do not think she was on that or other PPi long-term). So would take that for a month more and see if she can stop it. Follow-up GI as needed please cc this note to Dr. Celene Skeen when complete Iva Boop MD, [facility]  March 24, 2010 6:14 AM    Follow-up for Phone Call        Left message for patient to call back Darcey Nora RN, [facility]  March 24, 2010 10:09 AM  Results reviewed with the patient and Dr Marvell Fuller recommendations.  Patient will follow up as needed.  She verblaized understanding of all instructions. Follow-up by: Darcey Nora RN, CGRN,  March 24, 2010 12:06 PM

## 2010-04-27 ENCOUNTER — Telehealth: Payer: Self-pay | Admitting: Internal Medicine

## 2010-04-27 NOTE — Telephone Encounter (Signed)
I called [employer] because that is what the pharmacy sent over for patients prior auth.   I called [employer] and spoke with Maralyn Sago and she stated that she is faxing over a form for me to fill out and fax back for prior auth and then I should hear back 24-72 hours.  I received fax, filled out and faxed back. Sent down to be scanned into patients medical record.

## 2010-04-29 NOTE — Telephone Encounter (Signed)
Left message for pt to call back  °

## 2010-04-29 NOTE — Telephone Encounter (Signed)
Pt aware of Dr. Marvell Fuller recommendations. Let pt we would work on the prior [place] for [employer].

## 2010-04-29 NOTE — Telephone Encounter (Signed)
Pt states that she is better as far as the reflux but she is still complaining of having to go to the bathroom 3-4 times a day for BM's . They are normal in consistency she states but she wants to know if there is something she can take that may decrease the number of times she goes for BM's. Also states that she is taking a [employer] brand Probiotic. Dr. Leone Payor please advise.

## 2010-04-29 NOTE — Telephone Encounter (Signed)
Start with examining diet - reduce fiber, reduce caffeine if using

## 2010-04-29 NOTE — Telephone Encounter (Signed)
I called patient back and patient stated that she is not sure if she even needs Dexilant anymore and she is still taking Dexilant with an OTC PPI and cannot remember the name of the OTC PPI but no improvement in symptoms. Patient was also complaining of abdominal pain, and frequent BMs that is not loose or diarrhea. Patient states that she feels like she is not getting better.  I told patient that I would forward message to [facility] who is handling Dr Leone Payor triage and she will call her back.

## 2010-06-03 ENCOUNTER — Telehealth: Payer: Self-pay | Admitting: *Deleted

## 2010-06-03 NOTE — Telephone Encounter (Signed)
[facility] Dexilant.Jeanene Erb [employer] because I did prior auth in April and patient never returned message that she did not receive the medication.  Called [employer] and they said the received the fax for prior auth and it was done but they don't know what happened when prior auth was scanned in.  Told [employer] we never received fax nor did pharmacy, nor a phone call stating that there was a problem.   [employer] said they are sending new prior auth and wants it filled out again.  Filled out and faxed back today.  Called patient to see if she needed samples and to explain problem with prior auth and left message. Waiting on call back from patient

## 2010-06-09 NOTE — Assessment & Plan Note (Signed)
[facility]                         ELECTROPHYSIOLOGY OFFICE NOTE   NAME:Sarah Zhang, Sarah Zhang            MRN:          [id number]  DATE:06/08/2007                            DOB:          03/04/1960    Sarah Zhang is referred today by Dr. Charlesetta Shanks for evaluation of  palpitations and increasing fatigue and tiredness.  The patient is a  very pleasant 50 year old woman who has had a 1-2 month history of  palpitations.  She notes that she has been under increasing stress, that  she is concerned about her 43 year old daughter and her decisions about  where she will live in the fall.  The patient has never had symptoms  like this in the past.  She notes that she does have trouble sleeping at  nighttime and takes benzodiazepines to help with her sleep.  The patient  denies syncope.  She denies chest pain, except that she does note she  has some chest pressure when she gets into arguments with her 18-year-  old daughter.  However, the patient exercises on a regular basis and has  no trouble with chest discomfort with vigorous physical exertion.   ADDITIONAL PAST MEDICAL HISTORY:  1. Tonsillectomy in the past.  2. She has had removal of an ovarian cyst.  3. She has a history of cholecystectomy.  4. She has a history of appendectomy in the past.   FAMILY HISTORY:  Notable for her mother dying of stomach cancer, and her  father dying of cirrhosis and alcohol abuse.   SOCIAL HISTORY:  The patient is divorced.  She denies tobacco use,  stopping smoking in 1985.  She drinks several alcoholic beverages on the  weekend.   MEDICATIONS:  1. Lorazepam 1 mg, 1-2-3 tablets at night.  2. Oral contraceptive pills.  3. Multivitamin.   REVIEW OF SYSTEMS:  Otherwise negative, except as noted in the HPI.  She  has very mild arthritis as well.   PHYSICAL EXAMINATION:  GENERAL:  She is a pleasant, well-appearing 75-  year-old woman, looking younger than her  stated age.  VITAL SIGNS:  Blood pressure was 100/70, the pulse was 77 and regular,  the respirations were 18, weight was 123 pounds.  HEENT:  Normocephalic, atraumatic.  Pupils equal and round.  The  oropharynx was moist.  The sclerae were anicteric.  NECK:  Revealed no jugular venous distention.  There was no thyromegaly.  The trachea is midline.  The carotids are 2+ and symmetric.  LUNGS:  Clear bilaterally to auscultation.  No wheezes, rales, or  rhonchi are present.  CARDIOVASCULAR:  Revealed a regular rate and rhythm, with normal S1 and  S2.  There were no murmurs, rubs, or gallops.  The PMI was not enlarged  or laterally displaced.  ABDOMEN:  Soft, nontender, nondistended.  There was no organomegaly.  The bowel sounds were present.  No rebound or guarding.  EXTREMITIES:  Demonstrated no cyanosis, clubbing, or edema.  The pulses  were 2+ and symmetric.  NEUROLOGIC:  Alert and oriented x3, with cranial nerves intact. Strength  is 5/5 and symmetric.   EKG:  Demonstrates sinus rhythm, with normal axis  and intervals.   IMPRESSION:  1. Palpitations.  2. Fatigue  3. Very, very atypical chest discomfort.   DISCUSSION:  I have reassured Sarah Zhang that I do not think that her  palpitations are anything significant and that they most likely are PACs  and PVCs.  I suspect her fatigue is related to stress and that she is  taking chronic sleeping pills, and I have given steps as to how she  might reduce her dependency on benzodiazepines for sleep.  Finally,  because her chest pain has no relationship to exertion, I have  recommended a period of watchful waiting on this as well.  If her  symptoms worsen, then having her wear a cardiac monitor for 3 weeks  would be reasonable, as would a 2D echocardiogram be reasonable as well.  We will see the patient back in several months to see how she is doing.     Doylene Canning. Ladona Ridgel, MD  Electronically Signed    GWT/MedQ  DD: 06/08/2007  DT:  06/08/2007  Job #: [id number]   cc:   Charlesetta Shanks

## 2010-06-12 NOTE — Assessment & Plan Note (Signed)
[facility]                          [place] OFFICE NOTE   Sarah Zhang, Sarah Zhang                      MRN:          [id number]  DATE:09/03/2005                            DOB:          1960/02/08    HISTORY OF PRESENT ILLNESS:  Ms. Sarah Zhang is a 50 year old Hispanic female who  is a Runner, broadcasting/film/video at [employer].  She reports that over the last 3 months she has  been having discomfort in her hips bilaterally, usually more towards the  back.  She states that the pain and stiffness occurs in the morning when she  wakes up.  After several minutes she feels much better.  She denies any  weakness or paresthesias.  She does report some mild lumbar discomfort.   The patient also reports that she has pain in her feet.  Again, her feet are  comfortable with the first step in the morning.  This usually is localized  to the soles of the feet, more on the outer part of the arch.  She also  complains of similar discomfort in her thumbs in the morning which again  resolved after several minutes.  She denies any shoulder, elbow or knee  discomfort.  She is otherwise pretty active.  She denies any significant  fatigue.  She does report that aspirin helps with the discomfort but she is  afraid to take it regularly secondary to upset stomach.   PAST MEDICAL HISTORY:  Cholecystectomy.   MEDICATIONS:  Tums p.r.n.   ALLERGIES:  No known drug allergies.   FAMILY HISTORY:  Father passed away from cirrhosis of the liver.  Mother  passed away from stomach cancer.  She has six siblings with no significant  medical problem except for the psychiatric illness.   SOCIAL HISTORY:  The patient is a professor at [employer].  She is  married with two children.  She denies any alcohol or tobacco use.   PHYSICAL EXAMINATION:  VITAL SIGNS:  Weight is 128, pulse 80, blood pressure  100/72.  GENERAL:  She is a pleasant, Hispanic female in no acute distress.  She  answers  questions appropriately.  MUSCULOSKELETAL:  Examination of the lower back is significant for no  midline tenderness.  There is no paraspinal tenderness.  Examination of the  hip is significant for no instability.  She has full range of motion.  Flexion and extension of the hip causes no discomfort.  Examination of the  feet is significant for no obvious deformity.  No tenderness elicited  with  palpation.  Pedal pulses are 2+ and equal bilaterally.  Surveillance of the  large joints including shoulder, elbow, wrist, knee and ankle were  significant for no synovitis, weakness, stiffness or tenderness.   IMPRESSION:  This 50 year old female presents with arthralgias for the last  3 months.  It may be mechanically related but she has a remote family  history of rheumatoid arthritis and thus further evaluation is warranted.   PLAN:  1. Will obtain a CBC, comprehensive metabolic profile and a sedimentation      rate and rheumatoid factor.  2.  Advised the patient that she can take Tylenol Arthritis two every 8      hours as needed.  3. I will obtain an x-ray of her hip, lumbar spine and left foot.  4. Further recommendations after review of the above.                                   Leanne Chang, MD   LA/MedQ  DD:  09/03/2005  DT:  09/04/2005  Job #:  [id number]

## 2010-06-12 NOTE — Op Note (Signed)
NAME:  Sarah Zhang, Sarah Zhang               ACCOUNT NO.:  [id number]   MEDICAL RECORD NO.:  [id number]                   PATIENT TYPE:  AMB   LOCATION:  DAY                                  FACILITY:  [facility]   PHYSICIAN:  Timothy E. Earlene Plater, M.D.              DATE OF BIRTH:  Jul 24, 1960   DATE OF PROCEDURE:  02/23/2002  DATE OF DISCHARGE:                                 OPERATIVE REPORT   PREOPERATIVE DIAGNOSIS:  Biliary dyskinesia.   POSTOPERATIVE DIAGNOSIS:  Biliary dyskinesia.   PROCEDURE:  Laparoscopic cholecystectomy and operative cholangiogram.   SURGEON:  Timothy E. Earlene Plater, M.D.   ASSISTANT:  Gita Kudo, M.D.   ANESTHESIA:  General, Dr. Sandria Manly.   CLINICAL NOTE:  The patient is an otherwise-healthy 50 year old female who  has been seen and evaluated by Casimiro Needle E. Norins, M.D., and Ulyess Mort, M.D.  Findings have been evidence for biliary dyskinesia with  depressed ejection fraction.  Gallbladder ultrasound was normal.  The  patient's history is compatible with gallbladder disease and after careful  and complete discussion, she wishes to proceed with removal of the  gallbladder.  She has identified the pertinent side and evaluated by  anesthesia.  Her normal laboratory data and EKG were reviewed.   DESCRIPTION OF PROCEDURE:  She was taken to the operating room, placed  supine, general endotracheal anesthesia administered.  The abdomen was  prepped and draped in the usual fashion.  Marcaine 0.5% with epinephrine was  used throughout for local anesthesia.  A vertical incision made beneath the  umbilicus, the fascia identified, opened vertically, and the peritoneum  entered without complication.  A Hasson catheter placed and tied there with  a mattress suture of #1 Vicryl.  The abdomen insufflated and a general  peritoneoscopy carried out, including the pelvis, and all seemed to be  normal with a slight enlargement of the uterus.  The gallbladder was normal  in  appearance.  A second 10 mm trocar placed in the midepigastrium and two 5  mm trocars in the right upper quadrant.  The gallbladder was grasped, placed  on tension.  Inspection revealed it to appear normal.  Careful dissection  was begun at the base of the gallbladder, where the peritoneum was stripped  proximally and a tiny cystic duct was identified leading to the gallbladder.  This was completely dissected out.  Behind that was a double-branched cystic  artery.  The gallbladder was clipped near the origin of the cystic duct.  An  incision was made in the cystic duct with some difficulty because of its  small size and the Reddick catheter was threaded into this tiny duct.  A  clip was placed and then using real-time fluoroscopy, cholangiography  carried out.  There was slight extravasation of dye at the insertion site,  but the biliary tree filled nicely and appearance was normal.  It flowed  well into the duodenum.   With this information, the clip  was removed and the catheter removed, the  stump of the cystic carefully doubly clipped and divided, the cystic artery  doubly clipped and divided, the posterior branch doubly clipped and divided,  and the gallbladder was removed from the gallbladder bed.  A small nick was  made in the posterior wall of the gallbladder.  There was some bile leakage.  No stones were seen.  The gallbladder was removed from the gallbladder bed.  There was some bleeding in the gallbladder bed.  This was treated with  cautery and then application of Surgicel.  It was dry.  Copious irrigation  was carried out.  The gallbladder was placed in an EndoCatch bag and removed  from the abdominal cavity.  Further inspection was made in the gallbladder  bed, and it appeared dry.  Copious irrigation was carried out with 2 L of  saline until completely clear.  With this, the procedure was complete, all  counts correct.  All CO2, irrigant, instruments, and trocars removed  under  direct vision.  The skin incisions were closed with 4-0 Monocryl and Steri-  Strips.  Final counts correct.  She tolerated it well and was awakened and  taken to the recovery room in good condition.                                               Timothy E. Earlene Plater, M.D.    TED/MEDQ  D:  02/23/2002  T:  02/23/2002  Job:  [id number]   cc:   Ulyess Mort, M.D. [facility]

## 2010-07-13 ENCOUNTER — Encounter (INDEPENDENT_AMBULATORY_CARE_PROVIDER_SITE_OTHER): Payer: BC Managed Care – PPO | Admitting: Gynecology

## 2010-07-13 ENCOUNTER — Other Ambulatory Visit: Payer: Self-pay | Admitting: Gynecology

## 2010-07-13 ENCOUNTER — Other Ambulatory Visit (HOSPITAL_COMMUNITY)
Admission: RE | Admit: 2010-07-13 | Discharge: 2010-07-13 | Disposition: A | Discharge status: Home / Self Care | Payer: BC Managed Care – PPO | Source: Ambulatory Visit | Attending: Gynecology | Admitting: Gynecology

## 2010-07-13 DIAGNOSIS — Z124 Encounter for screening for malignant neoplasm of cervix: Secondary | ICD-10-CM | POA: Insufficient documentation

## 2010-07-13 DIAGNOSIS — N951 Menopausal and female climacteric states: Secondary | ICD-10-CM

## 2010-07-13 DIAGNOSIS — B373 Candidiasis of vulva and vagina: Secondary | ICD-10-CM

## 2010-07-13 DIAGNOSIS — Z833 Family history of diabetes mellitus: Secondary | ICD-10-CM

## 2010-07-13 DIAGNOSIS — Z01419 Encounter for gynecological examination (general) (routine) without abnormal findings: Secondary | ICD-10-CM

## 2010-07-13 DIAGNOSIS — E559 Vitamin D deficiency, unspecified: Secondary | ICD-10-CM

## 2010-07-13 DIAGNOSIS — Z13220 Encounter for screening for lipoid disorders: Secondary | ICD-10-CM

## 2010-07-24 ENCOUNTER — Encounter: Payer: 59 | Admitting: Gynecology

## 2010-08-04 ENCOUNTER — Ambulatory Visit (INDEPENDENT_AMBULATORY_CARE_PROVIDER_SITE_OTHER): Payer: BC Managed Care – PPO | Admitting: Gynecology

## 2010-08-04 DIAGNOSIS — L293 Anogenital pruritus, unspecified: Secondary | ICD-10-CM

## 2010-08-04 DIAGNOSIS — N898 Other specified noninflammatory disorders of vagina: Secondary | ICD-10-CM

## 2010-08-04 DIAGNOSIS — N926 Irregular menstruation, unspecified: Secondary | ICD-10-CM

## 2010-08-04 DIAGNOSIS — B373 Candidiasis of vulva and vagina: Secondary | ICD-10-CM

## 2010-11-16 ENCOUNTER — Other Ambulatory Visit: Payer: Self-pay | Admitting: *Deleted

## 2010-11-16 MED ORDER — LORAZEPAM 2 MG PO TABS: ORAL_TABLET | ORAL | Status: DC

## 2010-11-16 NOTE — Telephone Encounter (Signed)
Rx called in to pharmacy. 

## 2010-11-20 ENCOUNTER — Telehealth: Payer: Self-pay | Admitting: *Deleted

## 2010-11-20 NOTE — Telephone Encounter (Signed)
Pt called complaining of yeast infection asking for rx. Lm for pt to call.

## 2010-12-22 ENCOUNTER — Other Ambulatory Visit: Payer: Self-pay | Admitting: Gynecology

## 2010-12-22 DIAGNOSIS — Z1231 Encounter for screening mammogram for malignant neoplasm of breast: Secondary | ICD-10-CM

## 2011-01-14 ENCOUNTER — Ambulatory Visit
Admission: RE | Admit: 2011-01-14 | Discharge: 2011-01-14 | Disposition: A | Discharge status: Home / Self Care | Payer: BC Managed Care – PPO | Source: Ambulatory Visit | Attending: Gynecology | Admitting: Gynecology

## 2011-01-14 DIAGNOSIS — Z1231 Encounter for screening mammogram for malignant neoplasm of breast: Secondary | ICD-10-CM

## 2011-01-15 ENCOUNTER — Other Ambulatory Visit: Payer: Self-pay | Admitting: Gynecology

## 2011-01-22 ENCOUNTER — Ambulatory Visit (INDEPENDENT_AMBULATORY_CARE_PROVIDER_SITE_OTHER): Payer: BC Managed Care – PPO | Admitting: Gynecology

## 2011-01-22 ENCOUNTER — Encounter: Payer: Self-pay | Admitting: Gynecology

## 2011-01-22 VITALS — BP 116/70

## 2011-01-22 DIAGNOSIS — N644 Mastodynia: Secondary | ICD-10-CM

## 2011-01-22 NOTE — Progress Notes (Signed)
Patient presents having gone to have her routine screening mammogram and said the girl that her left nipple was sore and they referred her to her gynecologist before doing the mammogram. She notes over the last several weeks that her left nipple seems to feel sore almost a burning type sensation. No skin changes or nipple discharge. On exam last night she felt a small mass at the edge of the nipple.  Exam with Elane Fritz chaperone present Both breast examined lying and sitting, bilateral implants noted Right breast without masses retractions discharge adenopathy Left breast with small pointing skin needlelike area 6:00 on areola in scar line. Otherwise breasts without masses retractions discharge adenopathy. Area feels like a small stitch.  Assessment and plan: Left breast tenderness. I think this probably is more physiologic. Small area needlelike 6:00 edge of the areola in scar line from her implant surgery feels to be a small stitch. Is not inflammatory at all and feels connected to the skin. She had her surgery done in [place] and records are not available.  Discussed with patient options to see a plastic surgeon, consider excising this area versus observation was reviewed. Patient would prefer to watch for now. I am going to order a diagnostic mammogram and ultrasound of the left breast given the tenderness issue assuming these are all negative then I think we'll continue with self breast exams, as long as this probable stitch area remains stable we'll follow and she's comfortable with this.

## 2011-01-22 NOTE — Patient Instructions (Signed)
Follow up for mammogram and ultrasound as we scheduled.

## 2011-01-25 ENCOUNTER — Other Ambulatory Visit: Payer: Self-pay | Admitting: Gynecology

## 2011-01-27 ENCOUNTER — Other Ambulatory Visit: Payer: Self-pay | Admitting: *Deleted

## 2011-01-27 ENCOUNTER — Telehealth: Payer: Self-pay | Admitting: *Deleted

## 2011-01-27 DIAGNOSIS — N644 Mastodynia: Secondary | ICD-10-CM

## 2011-01-27 NOTE — Telephone Encounter (Signed)
Patient informed appt information.

## 2011-01-27 NOTE — Progress Notes (Signed)
Addended byValeda Malm L on: 01/27/2011 10:47 AM   Modules accepted: Orders

## 2011-01-27 NOTE — Telephone Encounter (Signed)
Lm for patient to call.  Need to give appt information.  Appt set up with [facility] on 01/29/11 @ 8:40.

## 2011-01-27 NOTE — Telephone Encounter (Signed)
Message copied by Libby Maw on Wed Jan 27, 2011 10:49 AM ------      Message from: Dara Lords      Created: Fri Jan 22, 2011  5:24 PM       Schedule diagnostic mammogram and ultrasound in reference to left breast. Areolar tenderness. Small area 6:00 areola and scar from reconstruction surgery feels like small stitch.

## 2011-01-29 ENCOUNTER — Ambulatory Visit
Admission: RE | Admit: 2011-01-29 | Discharge: 2011-01-29 | Disposition: A | Discharge status: Home / Self Care | Payer: BC Managed Care – PPO | Source: Ambulatory Visit | Attending: Gynecology | Admitting: Gynecology

## 2011-01-29 DIAGNOSIS — N644 Mastodynia: Secondary | ICD-10-CM

## 2011-02-12 ENCOUNTER — Other Ambulatory Visit: Payer: Self-pay | Admitting: *Deleted

## 2011-02-12 MED ORDER — LORAZEPAM 2 MG PO TABS: ORAL_TABLET | ORAL | Status: DC

## 2011-02-12 NOTE — Telephone Encounter (Signed)
rx called in

## 2011-03-11 ENCOUNTER — Telehealth: Payer: Self-pay | Admitting: *Deleted

## 2011-03-11 MED ORDER — LORAZEPAM 2 MG PO TABS: 2.0000 mg | ORAL_TABLET | Freq: Every evening | ORAL | Status: DC | PRN

## 2011-03-11 NOTE — Telephone Encounter (Signed)
Pt called asking for refill for her Ativan 2 mg, pt said she is leaving town tomorrow and only has 4 days worth of medication. Please advise.

## 2011-03-11 NOTE — Telephone Encounter (Signed)
Left message that  rx sent to pharmacy 

## 2011-04-27 ENCOUNTER — Ambulatory Visit: Payer: BC Managed Care – PPO | Admitting: Gynecology

## 2011-07-05 ENCOUNTER — Ambulatory Visit (INDEPENDENT_AMBULATORY_CARE_PROVIDER_SITE_OTHER): Payer: BC Managed Care – PPO | Admitting: Gynecology

## 2011-07-05 ENCOUNTER — Encounter: Payer: Self-pay | Admitting: Gynecology

## 2011-07-05 DIAGNOSIS — N949 Unspecified condition associated with female genital organs and menstrual cycle: Secondary | ICD-10-CM

## 2011-07-05 DIAGNOSIS — A499 Bacterial infection, unspecified: Secondary | ICD-10-CM

## 2011-07-05 DIAGNOSIS — R102 Pelvic and perineal pain: Secondary | ICD-10-CM

## 2011-07-05 DIAGNOSIS — N898 Other specified noninflammatory disorders of vagina: Secondary | ICD-10-CM

## 2011-07-05 DIAGNOSIS — B9689 Other specified bacterial agents as the cause of diseases classified elsewhere: Secondary | ICD-10-CM

## 2011-07-05 DIAGNOSIS — G4700 Insomnia, unspecified: Secondary | ICD-10-CM

## 2011-07-05 DIAGNOSIS — N750 Cyst of Bartholin's gland: Secondary | ICD-10-CM

## 2011-07-05 DIAGNOSIS — N760 Acute vaginitis: Secondary | ICD-10-CM

## 2011-07-05 MED ORDER — TINIDAZOLE 500 MG PO TABS: 2.0000 g | ORAL_TABLET | Freq: Once | ORAL | Status: AC

## 2011-07-05 MED ORDER — FLUCONAZOLE 150 MG PO TABS: 150.0000 mg | ORAL_TABLET | Freq: Once | ORAL | Status: AC

## 2011-07-05 MED ORDER — LORAZEPAM 2 MG PO TABS: 2.0000 mg | ORAL_TABLET | Freq: Every evening | ORAL | Status: DC | PRN

## 2011-07-05 NOTE — Patient Instructions (Signed)
Take antibiotics as prescribed. Follow up if discharge continues. Observe Bartholin's cyst. If it becomes painful or enlarges follow up for exam. Observe perianal sebaceous cyst.  If it enlarges or becomes tender follow up for exam. Follow up in one month for your annual gynecologic exam.

## 2011-07-05 NOTE — Progress Notes (Signed)
Patient presents with vaginal itching and discharge of the last several weeks. Also notes a bump around her anus. She does have some lower abdominal discomfort that she notes particularly at night when she is lying there very transient last seconds sharp stabbing. No dyspareunia nausea vomiting diarrhea constipation or UTI symptoms.  Exam was Sherrilyn Rist chaperone present Abdomen soft nontender without masses guarding rebound organomegaly. Pelvic external BUS vagina with 2 cm soft nontender left Bartholin's cyst. Vagina with white discharge. Cervix normal. Uterus normal size midline mobile nontender. Adnexa without masses or tenderness. Perineum/anus with small classic sebaceous cyst overlying blackhead 3:00 perianal position  Assessment and plan: 1. White discharge. KOH wet prep suspicious for BV. We'll treat with Tindamax 2 g daily x2 doses, alcohol avoidance reviewed. We'll also cover with Diflucan 150x1 dose given her history of recurrent yeast in the past. 2. Perianal sebaceous cyst, small. Reviewed with the patient we'll plan observation as it's nontender and not bothersome to her. 3. Left Bartholin's cyst. On questioning she apparently has had this years ago which was drained. It is noninflammatory and nontender to her does not bother her. Reviewed options with her to include observation, Ward catheter, marsupialization.  We'll plan observation with reexam next month when she is due for her annual exam which I reminded her to schedule. 4. Insomnia. She asked for a refill of her lorazepam 2 mg for which he takes 1/2-1 pill nightly. She's had a long history of insomnia which this helps with. I refilled her #30 with 2 refills.

## 2011-07-06 LAB — URINALYSIS W MICROSCOPIC + REFLEX CULTURE
Bilirubin Urine: NEGATIVE
Casts: NONE SEEN
Crystals: NONE SEEN
Glucose, UA: NEGATIVE mg/dL
Leukocytes, UA: NEGATIVE
Squamous Epithelial / LPF: NONE SEEN
Urobilinogen, UA: 0.2 mg/dL (ref 0.0–1.0)

## 2011-07-08 ENCOUNTER — Other Ambulatory Visit: Payer: Self-pay | Admitting: *Deleted

## 2011-07-14 ENCOUNTER — Encounter: Payer: Self-pay | Admitting: Gynecology

## 2011-07-14 ENCOUNTER — Ambulatory Visit (INDEPENDENT_AMBULATORY_CARE_PROVIDER_SITE_OTHER): Payer: BC Managed Care – PPO | Admitting: Gynecology

## 2011-07-14 VITALS — BP 114/70 | Ht 63.0 in | Wt 116.0 lb

## 2011-07-14 DIAGNOSIS — Z01419 Encounter for gynecological examination (general) (routine) without abnormal findings: Secondary | ICD-10-CM

## 2011-07-14 DIAGNOSIS — Z13220 Encounter for screening for lipoid disorders: Secondary | ICD-10-CM

## 2011-07-14 DIAGNOSIS — N750 Cyst of Bartholin's gland: Secondary | ICD-10-CM

## 2011-07-14 DIAGNOSIS — B373 Candidiasis of vulva and vagina: Secondary | ICD-10-CM

## 2011-07-14 DIAGNOSIS — N898 Other specified noninflammatory disorders of vagina: Secondary | ICD-10-CM

## 2011-07-14 DIAGNOSIS — L293 Anogenital pruritus, unspecified: Secondary | ICD-10-CM

## 2011-07-14 DIAGNOSIS — Z131 Encounter for screening for diabetes mellitus: Secondary | ICD-10-CM

## 2011-07-14 LAB — CBC WITH DIFFERENTIAL/PLATELET
Basophils Relative: 0 % (ref 0–1)
Eosinophils Absolute: 0.1 10*3/uL (ref 0.0–0.7)
Lymphs Abs: 4.8 10*3/uL — ABNORMAL HIGH (ref 0.7–4.0)
MCH: 31.5 pg (ref 26.0–34.0)
Neutrophils Relative %: 47 % (ref 43–77)
Platelets: 350 10*3/uL (ref 150–400)
RBC: 4.25 MIL/uL (ref 3.87–5.11)
WBC: 10.3 10*3/uL (ref 4.0–10.5)

## 2011-07-14 LAB — WET PREP FOR TRICH, YEAST, CLUE: Clue Cells Wet Prep HPF POC: NONE SEEN

## 2011-07-14 LAB — LIPID PANEL
Cholesterol: 173 mg/dL (ref 0–200)
Total CHOL/HDL Ratio: 2.6 Ratio

## 2011-07-14 MED ORDER — NORETHINDRONE ACET-ETHINYL EST 1-20 MG-MCG PO TABS: 1.0000 | ORAL_TABLET | Freq: Every day | ORAL | Status: DC

## 2011-07-14 MED ORDER — FLUCONAZOLE 200 MG PO TABS: 200.0000 mg | ORAL_TABLET | Freq: Every day | ORAL | Status: AC

## 2011-07-14 NOTE — Patient Instructions (Signed)
Continue on oral contraceptives as discussed. We'll plan on discontinuing next year. Continue to monitor Bartholin's cyst. If it becomes larger or uncomfortable represent for evaluation. Follow up in one year for annual gynecologic exam.

## 2011-07-14 NOTE — Progress Notes (Signed)
Sarah Zhang 12-16-1960 [id number]        51 y.o.  for annual exam.  Several issues noted below.  Past medical history,surgical history, medications, allergies, family history and social history were all reviewed and documented in the EPIC chart. ROS:  Was performed and pertinent positives and negatives are included in the history.  Exam: Sherrilyn Rist chaperone present Filed Vitals:   07/14/11 1407  BP: 114/70   General appearance  Normal Skin grossly normal Head/Neck normal with no cervical or supraclavicular adenopathy thyroid normal Lungs  clear Cardiac RR, without RMG Abdominal  soft, nontender, without masses, organomegaly or hernia Breasts  examined lying and sitting without masses, retractions, discharge or axillary adenopathy. Bilateral implants noted.   Pelvic  Ext/BUS/vagina  with white discharge,  Left 2 cm bartholin cyst   Cervix  normal   Uterus  anteverted, normal size, shape and contour, midline and mobile nontender   Adnexa  Without masses or tenderness    Anus and perineum  normal   Rectovaginal  normal sphincter tone without palpated masses or tenderness.    Assessment/Plan:  51 y.o. female for annual exam.    1. Vaginal itching discharge. Patient recently was treated for BV with Flagyl and Diflucan 150 mg x1. Said she had a slight improvement but still is having itching. Wet prep/exam is positive for yeast.  We'll treat with Diflucan 200 mg daily x3 days. Follow up if symptoms persist or recur. 2. Oral contraceptives.  Patient is on Loestrin 120 equivalent. She had a marginally elevated FSH last year and had restarted birth control pills due to irregular menses. Options for trial of stopping now versus continuing another year was discussed. I reviewed the risks of BCPs to include increased thrombosis, stroke, heart attack, DVT. She's otherwise healthy and wants to continue on these another year accepting the risks and I refilled her times a year. 3. Mammography. Patient  had her mammogram this year. She'll continue with annual mammography. SBE monthly reviewed. 4. Colonoscopy. Patient had a colonoscopy last year and follow up with their recommended interval. 5. DEXA. Patient's never had a bone density. She is on oral contraceptives and will plan on waiting and doing this later in her 36s. Increase calcium vitamin D. 6. Pap smear. The Pap smear was done today. Last Pap smear 2012. She has no history of abnormal Pap smears with numerous normal records in her chart. I reviewed current screening guidelines and recommended every 3-5 year screening Paps.   7. Lorazepam. Patient does use lorazepam intermittently to help with sleep that she has used for a number of years due to chronic insomnia.  She has enough now but I will refill throughout the or as needed. 8. Bartholin's cyst. Patient has a small left Bartholin's cyst which has been present for a long time. It seems to fluctuate in size and does not bother her. Options to remove versus observation reviewed. She prefers just to watch as well as remain stable or resolved then she'll follow. If it becomes symptomatic or enlarges then consider marsupialization. 9. Health maintenance. Baseline CBC lipid profile glucose urinalysis ordered. Assuming she continues well from a gynecologic standpoint she will see me in a year, sooner as needed.    Dara Lords MD, 2:39 PM 07/14/2011

## 2011-07-15 LAB — URINALYSIS W MICROSCOPIC + REFLEX CULTURE
Casts: NONE SEEN
Glucose, UA: NEGATIVE mg/dL
Leukocytes, UA: NEGATIVE
pH: 6.5 (ref 5.0–8.0)

## 2011-07-20 ENCOUNTER — Telehealth: Payer: Self-pay

## 2011-07-20 MED ORDER — TERCONAZOLE 0.8 % VA CREA: 1.0000 | TOPICAL_CREAM | Freq: Every day | VAGINAL | Status: AC

## 2011-07-20 NOTE — Telephone Encounter (Signed)
PT. NOTIFIED OF TF'S NOTE BELOW AND I TOLD HER HE SENT RX TO HER PHARMACY. 

## 2011-07-20 NOTE — Telephone Encounter (Signed)
PT. CURRENTLY MOVING AND UNABLE TO COME IN FOR ANOTHER O.V. STATES SHE TOOK BOTH RX'S YOU GAVE HER ON 07-14-11. RX'S ONLY HELPED SYMPTOMS OME DAY AND SYMPTOMS ARE NOW  THE SAME AS WHEN SHE CAME IN FOR THE LAST O.V. WITH YOU. REQUESTING STRONGER RX OR TAKE AN RX FOR MORE DAYS.

## 2011-07-20 NOTE — Telephone Encounter (Signed)
Terazol 3 day cream as directed.

## 2011-08-02 ENCOUNTER — Telehealth: Payer: Self-pay | Admitting: *Deleted

## 2011-08-02 MED ORDER — FLUCONAZOLE 200 MG PO TABS: 200.0000 mg | ORAL_TABLET | Freq: Every day | ORAL | Status: AC

## 2011-08-02 NOTE — Telephone Encounter (Signed)
Diflucan 200 mg daily x5 days. Office visit if symptoms persist 

## 2011-08-02 NOTE — Telephone Encounter (Signed)
Pt informed with the below note. 

## 2011-08-02 NOTE — Telephone Encounter (Signed)
Pt has as had treatment for yeast in BV infections in June, pt was given Terazol cream recently. Pt c/o vaginal itching still, she said it would calm down but then start back up again. C/o yellowish to creamish discharge, not clear, no odor . Pt has no insurance due to her moving. Please advise

## 2011-08-23 ENCOUNTER — Other Ambulatory Visit: Payer: Self-pay | Admitting: Gynecology

## 2011-08-23 MED ORDER — LORAZEPAM 2 MG PO TABS: 2.0000 mg | ORAL_TABLET | Freq: Every evening | ORAL | Status: DC | PRN

## 2011-08-23 NOTE — Telephone Encounter (Signed)
Rx phoned into pharmacy.

## 2012-02-03 ENCOUNTER — Other Ambulatory Visit: Payer: Self-pay | Admitting: Gynecology

## 2013-02-04 ENCOUNTER — Other Ambulatory Visit: Payer: Self-pay | Admitting: Gynecology

## 2013-11-26 ENCOUNTER — Encounter: Payer: Self-pay | Admitting: Gynecology

## 2014-06-17 ENCOUNTER — Encounter: Payer: Self-pay | Admitting: Internal Medicine

## 2015-09-24 DIAGNOSIS — B373 Candidiasis of vulva and vagina: Secondary | ICD-10-CM | POA: Diagnosis not present

## 2015-09-24 DIAGNOSIS — Z23 Encounter for immunization: Secondary | ICD-10-CM | POA: Diagnosis not present

## 2015-10-22 DIAGNOSIS — N760 Acute vaginitis: Secondary | ICD-10-CM | POA: Diagnosis not present

## 2015-10-22 DIAGNOSIS — B9689 Other specified bacterial agents as the cause of diseases classified elsewhere: Secondary | ICD-10-CM | POA: Diagnosis not present

## 2015-10-22 DIAGNOSIS — B373 Candidiasis of vulva and vagina: Secondary | ICD-10-CM | POA: Diagnosis not present

## 2015-11-12 DIAGNOSIS — G4700 Insomnia, unspecified: Secondary | ICD-10-CM | POA: Diagnosis not present

## 2015-11-12 DIAGNOSIS — Z809 Family history of malignant neoplasm, unspecified: Secondary | ICD-10-CM | POA: Diagnosis not present

## 2015-11-12 DIAGNOSIS — E039 Hypothyroidism, unspecified: Secondary | ICD-10-CM | POA: Diagnosis not present

## 2015-11-12 DIAGNOSIS — Z79899 Other long term (current) drug therapy: Secondary | ICD-10-CM | POA: Diagnosis not present

## 2015-11-12 DIAGNOSIS — R5383 Other fatigue: Secondary | ICD-10-CM | POA: Diagnosis not present

## 2015-12-10 DIAGNOSIS — R824 Acetonuria: Secondary | ICD-10-CM | POA: Diagnosis not present

## 2015-12-10 DIAGNOSIS — N952 Postmenopausal atrophic vaginitis: Secondary | ICD-10-CM | POA: Diagnosis not present

## 2015-12-10 DIAGNOSIS — N750 Cyst of Bartholin's gland: Secondary | ICD-10-CM | POA: Diagnosis not present

## 2015-12-10 DIAGNOSIS — B373 Candidiasis of vulva and vagina: Secondary | ICD-10-CM | POA: Diagnosis not present

## 2016-02-19 DIAGNOSIS — N898 Other specified noninflammatory disorders of vagina: Secondary | ICD-10-CM | POA: Diagnosis not present

## 2016-02-19 DIAGNOSIS — Z113 Encounter for screening for infections with a predominantly sexual mode of transmission: Secondary | ICD-10-CM | POA: Diagnosis not present

## 2016-02-19 DIAGNOSIS — B373 Candidiasis of vulva and vagina: Secondary | ICD-10-CM | POA: Diagnosis not present

## 2016-03-11 DIAGNOSIS — R87810 Cervical high risk human papillomavirus (HPV) DNA test positive: Secondary | ICD-10-CM | POA: Diagnosis not present

## 2016-03-11 DIAGNOSIS — N898 Other specified noninflammatory disorders of vagina: Secondary | ICD-10-CM | POA: Diagnosis not present

## 2016-03-11 DIAGNOSIS — Z124 Encounter for screening for malignant neoplasm of cervix: Secondary | ICD-10-CM | POA: Diagnosis not present

## 2016-03-11 DIAGNOSIS — Z01419 Encounter for gynecological examination (general) (routine) without abnormal findings: Secondary | ICD-10-CM | POA: Diagnosis not present

## 2016-03-11 DIAGNOSIS — N952 Postmenopausal atrophic vaginitis: Secondary | ICD-10-CM | POA: Diagnosis not present

## 2016-03-15 DIAGNOSIS — H11043 Peripheral pterygium, stationary, bilateral: Secondary | ICD-10-CM | POA: Diagnosis not present

## 2016-05-17 DIAGNOSIS — E785 Hyperlipidemia, unspecified: Secondary | ICD-10-CM | POA: Diagnosis not present

## 2016-05-17 DIAGNOSIS — E039 Hypothyroidism, unspecified: Secondary | ICD-10-CM | POA: Diagnosis not present

## 2016-05-17 DIAGNOSIS — Z79899 Other long term (current) drug therapy: Secondary | ICD-10-CM | POA: Diagnosis not present

## 2016-05-18 DIAGNOSIS — Z1231 Encounter for screening mammogram for malignant neoplasm of breast: Secondary | ICD-10-CM | POA: Diagnosis not present

## 2016-05-20 DIAGNOSIS — Z1211 Encounter for screening for malignant neoplasm of colon: Secondary | ICD-10-CM | POA: Diagnosis not present

## 2016-05-20 DIAGNOSIS — E039 Hypothyroidism, unspecified: Secondary | ICD-10-CM | POA: Diagnosis not present

## 2016-05-20 DIAGNOSIS — F5101 Primary insomnia: Secondary | ICD-10-CM | POA: Diagnosis not present

## 2016-05-20 DIAGNOSIS — Z6822 Body mass index (BMI) 22.0-22.9, adult: Secondary | ICD-10-CM | POA: Diagnosis not present

## 2016-05-20 DIAGNOSIS — E785 Hyperlipidemia, unspecified: Secondary | ICD-10-CM | POA: Diagnosis not present

## 2016-05-20 DIAGNOSIS — N760 Acute vaginitis: Secondary | ICD-10-CM | POA: Diagnosis not present

## 2016-05-20 DIAGNOSIS — Z713 Dietary counseling and surveillance: Secondary | ICD-10-CM | POA: Diagnosis not present

## 2016-05-20 DIAGNOSIS — Z0000 Encounter for general adult medical examination without abnormal findings: Secondary | ICD-10-CM | POA: Diagnosis not present

## 2016-06-01 DIAGNOSIS — F411 Generalized anxiety disorder: Secondary | ICD-10-CM | POA: Diagnosis not present

## 2016-06-08 DIAGNOSIS — K219 Gastro-esophageal reflux disease without esophagitis: Secondary | ICD-10-CM | POA: Diagnosis not present

## 2016-09-03 DIAGNOSIS — Z113 Encounter for screening for infections with a predominantly sexual mode of transmission: Secondary | ICD-10-CM | POA: Diagnosis not present

## 2016-09-03 DIAGNOSIS — N898 Other specified noninflammatory disorders of vagina: Secondary | ICD-10-CM | POA: Diagnosis not present

## 2016-09-03 DIAGNOSIS — Z6822 Body mass index (BMI) 22.0-22.9, adult: Secondary | ICD-10-CM | POA: Diagnosis not present

## 2016-09-03 DIAGNOSIS — N909 Noninflammatory disorder of vulva and perineum, unspecified: Secondary | ICD-10-CM | POA: Diagnosis not present

## 2016-11-25 DIAGNOSIS — E039 Hypothyroidism, unspecified: Secondary | ICD-10-CM | POA: Diagnosis not present

## 2016-11-25 DIAGNOSIS — N763 Subacute and chronic vulvitis: Secondary | ICD-10-CM | POA: Diagnosis not present

## 2016-12-03 DIAGNOSIS — J069 Acute upper respiratory infection, unspecified: Secondary | ICD-10-CM | POA: Diagnosis not present

## 2017-01-13 DIAGNOSIS — Z0000 Encounter for general adult medical examination without abnormal findings: Secondary | ICD-10-CM | POA: Diagnosis not present

## 2017-01-13 DIAGNOSIS — Z13220 Encounter for screening for lipoid disorders: Secondary | ICD-10-CM | POA: Diagnosis not present

## 2017-02-07 DIAGNOSIS — H2513 Age-related nuclear cataract, bilateral: Secondary | ICD-10-CM | POA: Diagnosis not present

## 2017-02-07 DIAGNOSIS — H11043 Peripheral pterygium, stationary, bilateral: Secondary | ICD-10-CM | POA: Diagnosis not present

## 2017-03-03 DIAGNOSIS — E039 Hypothyroidism, unspecified: Secondary | ICD-10-CM | POA: Diagnosis not present

## 2017-05-18 DIAGNOSIS — R238 Other skin changes: Secondary | ICD-10-CM | POA: Diagnosis not present

## 2017-05-18 DIAGNOSIS — R229 Localized swelling, mass and lump, unspecified: Secondary | ICD-10-CM | POA: Diagnosis not present

## 2017-05-18 DIAGNOSIS — G4700 Insomnia, unspecified: Secondary | ICD-10-CM | POA: Diagnosis not present

## 2017-06-23 ENCOUNTER — Encounter: Payer: Self-pay | Admitting: Obstetrics & Gynecology

## 2017-06-23 ENCOUNTER — Ambulatory Visit (INDEPENDENT_AMBULATORY_CARE_PROVIDER_SITE_OTHER): Payer: BLUE CROSS/BLUE SHIELD | Admitting: Obstetrics & Gynecology

## 2017-06-23 VITALS — BP 114/78 | Ht 62.5 in | Wt 120.0 lb

## 2017-06-23 DIAGNOSIS — Z780 Asymptomatic menopausal state: Secondary | ICD-10-CM

## 2017-06-23 DIAGNOSIS — Z01419 Encounter for gynecological examination (general) (routine) without abnormal findings: Secondary | ICD-10-CM

## 2017-06-23 DIAGNOSIS — Z13820 Encounter for screening for osteoporosis: Secondary | ICD-10-CM | POA: Diagnosis not present

## 2017-06-23 DIAGNOSIS — Z1151 Encounter for screening for human papillomavirus (HPV): Secondary | ICD-10-CM

## 2017-06-23 DIAGNOSIS — R87610 Atypical squamous cells of undetermined significance on cytologic smear of cervix (ASC-US): Secondary | ICD-10-CM | POA: Diagnosis not present

## 2017-06-23 DIAGNOSIS — Z113 Encounter for screening for infections with a predominantly sexual mode of transmission: Secondary | ICD-10-CM | POA: Diagnosis not present

## 2017-06-23 NOTE — Progress Notes (Signed)
Sarah Zhang April 16, 1960 [id number]   History:    57 y.o. G3P2A1L2 Divorced.  Daughters in their 50's.  Has grand-children.  RP:  Established patient presenting for annual gyn exam   HPI: Menopause, well on no HRT.  No PMB.  No pelvic pain.  Currently abstinent.  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  Treated with TCA.  No warts currently.  Had the Gardasil immunization.  Urine/BMs wnl.  Breasts wnl, status post bilateral augmentation.  Body mass index 21.6.  Going to the gym and healthy nutrition.  Past medical history,surgical history, family history and social history were all reviewed and documented in the EPIC chart.  Gynecologic History Patient's last menstrual period was 06/20/2011. Contraception: post menopausal status Last Pap: 2018. Results were: normal Last mammogram: 03/2016.  Result:  Normal per patient.  Will schedule now. Bone Density:  Colonoscopy: 5 years ago  Obstetric History OB History  Gravida Para Term Preterm AB Living  SAB TAB Ectopic Multiple Live Births  1            # Outcome Date GA Lbr Len/2nd Weight Sex Delivery Anes PTL Lv  3 SAB           2 Para           1 Para              ROS: A ROS was performed and pertinent positives and negatives are included in the history.  GENERAL: No fevers or chills. HEENT: No change in vision, no earache, sore throat or sinus congestion. NECK: No pain or stiffness. CARDIOVASCULAR: No chest pain or pressure. No palpitations. PULMONARY: No shortness of breath, cough or wheeze. GASTROINTESTINAL: No abdominal pain, nausea, vomiting or diarrhea, melena or bright red blood per rectum. GENITOURINARY: No urinary frequency, urgency, hesitancy or dysuria. MUSCULOSKELETAL: No joint or muscle pain, no back pain, no recent trauma. DERMATOLOGIC: No rash, no itching, no lesions. ENDOCRINE: No polyuria, polydipsia, no heat or cold intolerance. No recent change in weight. HEMATOLOGICAL: No anemia or easy  bruising or bleeding. NEUROLOGIC: No headache, seizures, numbness, tingling or weakness. PSYCHIATRIC: No depression, no loss of interest in normal activity or change in sleep pattern.     Exam:   BP 114/78   Ht 5' 2.5" (1.588 m)   Wt 120 lb (54.4 kg)   LMP 06/20/2011   BMI 21.60 kg/m   Body mass index is 21.6 kg/m.  General appearance : Well developed well nourished female. No acute distress HEENT: Eyes: no retinal hemorrhage or exudates,  Neck supple, trachea midline, no carotid bruits, no thyroidmegaly Lungs: Clear to auscultation, no rhonchi or wheezes, or rib retractions  Heart: Regular rate and rhythm, no murmurs or gallops Breast:Examined in sitting and supine position were symmetrical in appearance, status post bilateral breast augmentation, no palpable masses or tenderness,  no skin retraction, no nipple inversion, no nipple discharge, no skin discoloration, no axillary or supraclavicular lymphadenopathy Abdomen: no palpable masses or tenderness, no rebound or guarding Extremities: no edema or skin discoloration or tenderness  Pelvic: Vulva: Normal             Vagina: No gross lesions or discharge  Cervix: No gross lesions or discharge.  Pap/HR HPV done.  Uterus  AV, normal size, shape and consistency, non-tender and mobile  Adnexa  Without masses or tenderness  Anus: Normal   Assessment/Plan:  57 y.o. female for annual exam  1. Encounter for routine gynecological examination with Papanicolaou smear of cervix Normal gynecologic exam.  Pap with high-risk HPV done today.  Breast exam normal status post bilateral implants.  Will schedule screening mammogram at the breast center now.  Health labs with family physician.  Body mass index 21.6.  Continue with fitness and healthy nutrition.  2. Menopause present Well on no hormone replacement therapy.  No postmenopausal bleeding. - VITAMIN D 25 Hydroxy (Vit-D Deficiency, Fractures)  3. Screen for STD (sexually transmitted  disease) Strict condom use recommended. - Gono-Chlam on Pap - HIV antibody (with reflex) - RPR - Hepatitis C Antibody - Hepatitis B Surface AntiGEN  4. Osteoporosis screening Vitamin D supplements, calcium rich nutrition and regular weightbearing physical activity recommended.  Patient will follow up here for bone density, scheduling now. - VITAMIN D 25 Hydroxy (Vit-D Deficiency, Fractures) - DG Bone Density; Future  Sarah Del MD, 4:18 PM 06/23/2017

## 2017-06-23 NOTE — Addendum Note (Signed)
Addended by: Berna Spare A on: 06/23/2017 05:02 PM   Modules accepted: Orders

## 2017-06-23 NOTE — Patient Instructions (Signed)
1. Encounter for routine gynecological examination with Papanicolaou smear of cervix Normal gynecologic exam.  Pap with high-risk HPV done today.  Breast exam normal status post bilateral implants.  Will schedule screening mammogram at the breast center now.  Health labs with family physician.  Body mass index 21.6.  Continue with fitness and healthy nutrition.  2. Menopause present Well on no hormone replacement therapy.  No postmenopausal bleeding. - VITAMIN D 25 Hydroxy (Vit-D Deficiency, Fractures)  3. Screen for STD (sexually transmitted disease) Strict condom use recommended. - Gono-Chlam on Pap - HIV antibody (with reflex) - RPR - Hepatitis C Antibody - Hepatitis B Surface AntiGEN  4. Osteoporosis screening Vitamin D supplements, calcium rich nutrition and regular weightbearing physical activity recommended.  Patient will follow up here for bone density, scheduling now. - VITAMIN D 25 Hydroxy (Vit-D Deficiency, Fractures) - DG Bone Density; Future  Kateryna, it was a pleasure meeting you today!  I will inform you of your results as soon as they are available.

## 2017-06-24 LAB — PAP IG, CT-NG NAA, HPV HIGH-RISK
C. trachomatis RNA, TMA: NOT DETECTED
HPV DNA High Risk: NOT DETECTED
N. gonorrhoeae RNA, TMA: NOT DETECTED

## 2017-06-24 LAB — HEPATITIS C ANTIBODY
Hepatitis C Ab: NONREACTIVE
SIGNAL TO CUT-OFF: 0.02 (ref <1.00)

## 2017-06-24 LAB — VITAMIN D 25 HYDROXY (VIT D DEFICIENCY, FRACTURES): Vit D, 25-Hydroxy: 41 ng/mL (ref 30–100)

## 2017-06-24 LAB — RPR: RPR Ser Ql: NONREACTIVE

## 2017-06-24 LAB — HIV ANTIBODY (ROUTINE TESTING W REFLEX): HIV 1&2 Ab, 4th Generation: NONREACTIVE

## 2017-06-24 LAB — HEPATITIS B SURFACE ANTIGEN: Hepatitis B Surface Ag: NONREACTIVE

## 2017-06-27 ENCOUNTER — Other Ambulatory Visit: Payer: Self-pay | Admitting: Obstetrics & Gynecology

## 2017-06-27 DIAGNOSIS — Z1231 Encounter for screening mammogram for malignant neoplasm of breast: Secondary | ICD-10-CM

## 2017-07-07 ENCOUNTER — Other Ambulatory Visit: Payer: Self-pay | Admitting: Gynecology

## 2017-07-07 DIAGNOSIS — Z13820 Encounter for screening for osteoporosis: Secondary | ICD-10-CM

## 2017-07-19 DIAGNOSIS — J029 Acute pharyngitis, unspecified: Secondary | ICD-10-CM | POA: Diagnosis not present

## 2017-07-19 DIAGNOSIS — B001 Herpesviral vesicular dermatitis: Secondary | ICD-10-CM | POA: Diagnosis not present

## 2017-07-21 ENCOUNTER — Ambulatory Visit (INDEPENDENT_AMBULATORY_CARE_PROVIDER_SITE_OTHER): Payer: BLUE CROSS/BLUE SHIELD

## 2017-07-21 DIAGNOSIS — Z13820 Encounter for screening for osteoporosis: Secondary | ICD-10-CM | POA: Diagnosis not present

## 2017-07-25 ENCOUNTER — Encounter: Payer: Self-pay | Admitting: Gynecology

## 2017-08-01 ENCOUNTER — Ambulatory Visit
Admission: RE | Admit: 2017-08-01 | Discharge: 2017-08-01 | Disposition: A | Discharge status: Home / Self Care | Payer: BLUE CROSS/BLUE SHIELD | Source: Ambulatory Visit | Attending: Obstetrics & Gynecology | Admitting: Obstetrics & Gynecology

## 2017-08-01 DIAGNOSIS — Z1231 Encounter for screening mammogram for malignant neoplasm of breast: Secondary | ICD-10-CM

## 2017-08-02 ENCOUNTER — Telehealth: Payer: Self-pay

## 2017-08-02 NOTE — Telephone Encounter (Signed)
Patient called for bone density test results. I called her back to let her know letter has been mailed on 07/29/17 informing her but I read it to her as a copy was in the chart.

## 2017-08-11 DIAGNOSIS — E039 Hypothyroidism, unspecified: Secondary | ICD-10-CM | POA: Diagnosis not present

## 2017-08-11 DIAGNOSIS — G4700 Insomnia, unspecified: Secondary | ICD-10-CM | POA: Diagnosis not present

## 2017-08-11 HISTORY — PX: OTHER SURGICAL HISTORY: SHX169

## 2017-12-12 DIAGNOSIS — B001 Herpesviral vesicular dermatitis: Secondary | ICD-10-CM | POA: Diagnosis not present

## 2017-12-12 DIAGNOSIS — R079 Chest pain, unspecified: Secondary | ICD-10-CM | POA: Diagnosis not present

## 2017-12-12 DIAGNOSIS — N952 Postmenopausal atrophic vaginitis: Secondary | ICD-10-CM | POA: Diagnosis not present

## 2017-12-12 DIAGNOSIS — E039 Hypothyroidism, unspecified: Secondary | ICD-10-CM | POA: Diagnosis not present

## 2017-12-16 ENCOUNTER — Ambulatory Visit
Admission: RE | Admit: 2017-12-16 | Discharge: 2017-12-16 | Disposition: A | Discharge status: Home / Self Care | Payer: BLUE CROSS/BLUE SHIELD | Source: Ambulatory Visit | Attending: Family Medicine | Admitting: Family Medicine

## 2017-12-16 ENCOUNTER — Other Ambulatory Visit: Payer: Self-pay | Admitting: Family Medicine

## 2017-12-16 DIAGNOSIS — R079 Chest pain, unspecified: Secondary | ICD-10-CM

## 2018-01-30 DIAGNOSIS — E039 Hypothyroidism, unspecified: Secondary | ICD-10-CM | POA: Diagnosis not present

## 2018-02-02 DIAGNOSIS — Z0000 Encounter for general adult medical examination without abnormal findings: Secondary | ICD-10-CM | POA: Diagnosis not present

## 2018-02-02 DIAGNOSIS — R079 Chest pain, unspecified: Secondary | ICD-10-CM | POA: Diagnosis not present

## 2018-02-02 DIAGNOSIS — R413 Other amnesia: Secondary | ICD-10-CM | POA: Diagnosis not present

## 2018-02-02 DIAGNOSIS — M419 Scoliosis, unspecified: Secondary | ICD-10-CM | POA: Diagnosis not present

## 2018-02-02 DIAGNOSIS — G4700 Insomnia, unspecified: Secondary | ICD-10-CM | POA: Diagnosis not present

## 2018-02-06 ENCOUNTER — Encounter: Payer: Self-pay | Admitting: Neurology

## 2018-02-20 DIAGNOSIS — J019 Acute sinusitis, unspecified: Secondary | ICD-10-CM | POA: Diagnosis not present

## 2018-02-20 DIAGNOSIS — G4700 Insomnia, unspecified: Secondary | ICD-10-CM | POA: Diagnosis not present

## 2018-02-20 DIAGNOSIS — K219 Gastro-esophageal reflux disease without esophagitis: Secondary | ICD-10-CM | POA: Diagnosis not present

## 2018-02-21 ENCOUNTER — Other Ambulatory Visit: Payer: Self-pay

## 2018-02-21 ENCOUNTER — Ambulatory Visit: Payer: BLUE CROSS/BLUE SHIELD | Admitting: Neurology

## 2018-02-21 ENCOUNTER — Encounter: Payer: Self-pay | Admitting: Neurology

## 2018-02-21 ENCOUNTER — Other Ambulatory Visit (INDEPENDENT_AMBULATORY_CARE_PROVIDER_SITE_OTHER): Payer: BLUE CROSS/BLUE SHIELD

## 2018-02-21 VITALS — BP 90/68 | HR 99 | Ht 62.5 in | Wt 118.0 lb

## 2018-02-21 DIAGNOSIS — F329 Major depressive disorder, single episode, unspecified: Secondary | ICD-10-CM | POA: Diagnosis not present

## 2018-02-21 DIAGNOSIS — R413 Other amnesia: Secondary | ICD-10-CM

## 2018-02-21 DIAGNOSIS — F32A Depression, unspecified: Secondary | ICD-10-CM

## 2018-02-21 DIAGNOSIS — F419 Anxiety disorder, unspecified: Secondary | ICD-10-CM

## 2018-02-21 LAB — VITAMIN B12: Vitamin B-12: 762 pg/mL (ref 211–911)

## 2018-02-21 NOTE — Progress Notes (Signed)
NEUROLOGY CONSULTATION NOTE  Sarah Zhang MRN: [id number] DOB: 29-Nov-1960  Referring provider: Dr. Farris HasAaron Morrow Primary care provider: Dr. Farris HasAaron Morrow  Reason for consult:  Memory loss  Dear Dr Kateri PlummerMorrow:  Thank you for your kind referral of Sarah Zhang for consultation of the above symptoms. Although her history is well known to you, please allow me to reiterate it for the purpose of our medical record. She is alone in the office today. Records and images were personally reviewed where available.  HISTORY OF PRESENT ILLNESS: This is a pleasant [age] year old ambidextrous left hand dominant woman with a history of hypothyroidism, presenting for evaluation of memory loss. She started noticing memory changes over the past year where she would have a hard time remembering names and faces. 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 She was concerned when she was out at night one time and saw one her students but did not recognize her until they introduced themselves. She taught Spanish immersion class for 6 years, then now back to [place] (stating it is her second language), but finding it hard to write reports, misspelling words that she has to spell check them. Her daughter tells her she repeats herself. She lives alone and denies getting lost driving. She denies any missed medication or missed bills, stating she is very organized. She went to [place] last December and while at the airport started worrying about herself when she thought of friends' parents with dementia who had difficulty navigating airports. She had hidden her passport for safety but cannot find it. She denies any paranoia, stating she has always been careful, putting door stops because she lives alone, but feels she is doing this more. She moved back to [place] 2 years ago and recently broke up with  her boyfriend.   She has occasional back pain. She denies any headaches, dizziness, vision changes, dysarthria/dysphagia, neck pain, focal numbness/tingling/weakness, bowel/bladder dysfunction, anosmia, or tremors. She takes Ativan every night for anxiety and sleep, usually getting 6.5-7 hours of sleep, but still feeling tired in the morning. No daytime drowsiness. She thinks her mood is okay, she lost weight recently due to thyroid issues and started getting depressed, this has improved with dose adjustment. She feels okay but sometimes feels sad. She became tearful at the end of the visit, stating she does get depressed and is trying to be strong. No family history of dementia. No history of significant head injuries. She drinks alcohol on the weekends.   PAST MEDICAL HISTORY: Past Medical History:  Diagnosis Date  . Hiatal hernia     PAST SURGICAL HISTORY: Past Surgical History:  Procedure Laterality Date  . APPENDECTOMY    . AUGMENTATION MAMMAPLASTY    . BREAST ENHANCEMENT [place]  . DILATION AND CURETTAGE OF UTERUS    . GALLBLADDER SURGERY    . OVARIAN CYST REMOVAL     times 2  . TONSILLECTOMY    . tummy t[place]  [place]    MEDICATIONS: Current Outpatient Medications on File Prior to Visit  Medication Sig Dispense Refill  . CALCIUM PO Take by mouth.      . Cholecalciferol (VITAMIN D PO) Take by mouth.      . fish oil-omega-3 fatty acids 1000 MG capsule Take 1 g by mouth daily.      .Marland Kitchen  levothyroxine (SYNTHROID, LEVOTHROID) 88 MCG tablet     . LORazepam (ATIVAN) 2 MG tablet Take 1 tablet (2 mg total) by mouth at bedtime as needed for anxiety. 30 tablet 1  . Multiple Vitamin (MULTIVITAMIN) capsule Take 1 capsule by mouth daily.      Marland Kitchen omeprazole (PRILOSEC) 40 MG capsule Take 40 mg by mouth daily.    . valACYclovir (VALTREX) 500 MG tablet TAKE 4 TABLETS TWICE DAILY 16 tablet 6  . vitamin E 400 UNIT capsule Take 400 Units by mouth daily.       No  current facility-administered medications on file prior to visit.     ALLERGIES: Allergies  Allergen Reactions  . Codeine   . Sulfonamide Derivatives     FAMILY HISTORY: Family History  Problem Relation Age of Onset  . Stomach cancer Mother   . Cancer Mother        stomach  . Hypertension Father   . Stomach cancer Paternal Uncle   . Breast cancer Paternal Grandmother     SOCIAL HISTORY: Social History   Socioeconomic History  . Marital status: Divorced    Spouse name: Not on file  . Number of children: Not on file  . Years of education: Not on file  . Highest education level: Not on file  Occupational History  . Not on file  Social Needs  . Financial resource strain: Not on file  . Food insecurity:    Worry: Not on file    Inability: Not on file  . Transportation needs:    Medical: Not on file    Non-medical: Not on file  Tobacco Use  . Smoking status: Former Smoker    Last attempt to quit: 01/22/1987    Years since quitting: 31.1  . Smokeless tobacco: Never Used  Substance and Sexual Activity  . Alcohol use: Yes    Comment: WEEKENDS  . Drug use: No  . Sexual activity: Not Currently    Comment: 1st intercourse- 20, partners- 3, divorced   Lifestyle  . Physical activity:    Days per week: Not on file    Minutes per session: Not on file  . Stress: Not on file  Relationships  . Social connections:    Talks on phone: Not on file    Gets together: Not on file    Attends religious service: Not on file    Active member of club or organization: Not on file    Attends meetings of clubs or organizations: Not on file    Relationship status: Not on file  . Intimate partner violence:    Fear of current or ex partner: Not on file    Emotionally abused: Not on file    Physically abused: Not on file    Forced sexual activity: Not on file  Other Topics Concern  . 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    REVIEW OF SYSTEMS: Constitutional: No fevers, chills, or sweats, no generalized fatigue, change in appetite Eyes: No visual changes, double vision, eye pain Ear, nose and throat: No hearing loss, ear pain, nasal congestion, sore throat Cardiovascular: No chest pain, palpitations Respiratory:  No shortness of breath at rest or with exertion, wheezes GastrointestinaI: No nausea, vomiting, diarrhea,  abdominal pain, fecal incontinence Genitourinary:  No dysuria, urinary retention or frequency Musculoskeletal:  No neck pain, +back pain Integumentary: No rash, pruritus, skin lesions Neurological: as above Psychiatric: + depression, insomnia, anxiety Endocrine: No palpitations, fatigue, diaphoresis, mood swings, change in appetite, change in weight, increased thirst Hematologic/Lymphatic:  No anemia, purpura, petechiae. Allergic/Immunologic: no itchy/runny eyes, nasal congestion, recent allergic reactions, rashes  PHYSICAL EXAM: Vitals:   02/21/18 1416  BP: 90/68  Pulse: 99  SpO2: 96%   General: No acute distress Head:  Normocephalic/atraumatic Eyes: Fundoscopic exam shows bilateral sharp discs, no vessel changes, exudates, or hemorrhages Neck: supple, no paraspinal tenderness, full range of motion Back: No paraspinal tenderness Heart: regular rate and rhythm Lungs: Clear to auscultation bilaterally. Vascular: No carotid bruits. Skin/Extremities: No rash, no edema Neurological Exam: Mental status: alert and oriented to person, place, and time, no dysarthria or aphasia, Fund of knowledge is appropriate.  Recent and remote memory are intact.  Attention and concentration are normal.    Able to name objects and repeat phrases. She had more difficulty with visuospatial tasks. Montreal Cognitive Assessment  02/21/2018  Visuospatial/ Executive (0/5) 3    Naming (0/3) 3  Attention: Read list of digits (0/2) 2  Attention: Read list of letters (0/1) 1  Attention: Serial 7 subtraction starting at 100 (0/3) 2  Language: Repeat phrase (0/2) 1  Language : Fluency (0/1) 1  Abstraction (0/2) 2  Delayed Recall (0/5) 3  Orientation (0/6) 6  Total 24   Cranial nerves: CN I: not tested CN II: pupils equal, round and reactive to light, visual fields intact, fundi unremarkable. CN III, IV, VI:  full range of motion, no nystagmus, no ptosis CN V: facial sensation intact CN VII: upper and lower face symmetric CN VIII: hearing intact to finger rub CN IX, X: gag intact, uvula midline CN XI: sternocleidomastoid and trapezius muscles intact CN XII: tongue midline Bulk & Tone: normal, no fasciculations. Motor: 5/5 throughout with no pronator drift. Sensation: intact to light touch, cold, pin, vibration and joint position sense.  No extinction to double simultaneous stimulation.  Romberg test negative Deep Tendon Reflexes: +2 throughout, no ankle clonus Plantar responses: downgoing bilaterally Cerebellar: no incoordination on finger to nose, heel to shin. No dysdiadochokinesia Gait: narrow-based and steady, able to tandem walk adequately. Tremor: none  IMPRESSION: This is a very pleasant 58 year old ambidextrous left hand dominant woman with a history of hypothyroidism, presenting for evaluation of worsening memory. Her neurological exam is normal, MOCA score today 24/30, indicating Mild Cognitive Impairment. No difficulties with complex tasks. We discussed different causes of memory changes, check B12 level. We discussed doing brain imaging. We also discussed how mood changes can affect memory, she became tearful and feels this is contributing to her symptoms, she is under a lot of stress with her recent move and breakup. She would like to hold off on brain imaging for now and proceed with seeing a psychotherapist. We discussed that if memory changes  continue after managing mood better, we can proceed with brain imaging and likely Neurocognitive testing. We discussed the importance of control of vascular risk factors, physical exercise, and brain stimulation exercises for brain health. Follow-up in 6 months, she knows to call for any changes.   Thank you for allowing me to participate in the care of this patient. Please do not hesitate to call for any questions or concerns.   Patrcia DollyKaren Aquino, M.D.  CC: Dr. Kateri PlummerMorrow

## 2018-02-21 NOTE — Patient Instructions (Addendum)
Great meeting you! 1. Check B12 level 2. Refer to [facility] for counseling 3. Follow-up in 6 months, call for any changes   RECOMMENDATIONS FOR ALL PATIENTS WITH MEMORY PROBLEMS: 1. Continue to exercise (Recommend 30 minutes of walking everyday, or 3 hours every week) 2. Increase social interactions - continue going to [place] and enjoy social gatherings with friends and family 3. Eat healthy, avoid fried foods and eat more fruits and vegetables 4. Maintain adequate blood pressure, blood sugar, and blood cholesterol level. Reducing the risk of stroke and cardiovascular disease also helps promoting better memory. 5. Avoid stressful situations. Live a simple life and avoid aggravations. Organize your time and prepare for the next day in anticipation. 6. Sleep well, avoid any interruptions of sleep and avoid any distractions in the bedroom that may interfere with adequate sleep quality 7. Avoid sugar, avoid sweets as there is a strong link between excessive sugar intake, diabetes, and cognitive impairment The Mediterranean diet has been shown to help patients reduce the risk of progressive memory disorders and reduces cardiovascular risk. This includes eating fish, eat fruits and green leafy vegetables, nuts like almonds and hazelnuts, walnuts, and also use olive oil. Avoid fast foods and fried foods as much as possible. Avoid sweets and sugar as sugar use has been linked to worsening of memory function.

## 2018-02-23 ENCOUNTER — Telehealth: Payer: Self-pay

## 2018-02-23 NOTE — Telephone Encounter (Signed)
LMOM relaying results below.  

## 2018-02-23 NOTE — Telephone Encounter (Signed)
-----   Message from Van Clines, MD sent at 02/22/2018  9:10 AM EST ----- Pls let her know B12 level looks good, normal. Thanks

## 2018-02-27 DIAGNOSIS — R0781 Pleurodynia: Secondary | ICD-10-CM | POA: Diagnosis not present

## 2018-03-14 DIAGNOSIS — R0781 Pleurodynia: Secondary | ICD-10-CM | POA: Diagnosis not present

## 2018-03-14 DIAGNOSIS — M546 Pain in thoracic spine: Secondary | ICD-10-CM | POA: Diagnosis not present

## 2018-03-17 DIAGNOSIS — B309 Viral conjunctivitis, unspecified: Secondary | ICD-10-CM | POA: Diagnosis not present

## 2018-03-17 DIAGNOSIS — J040 Acute laryngitis: Secondary | ICD-10-CM | POA: Diagnosis not present

## 2018-04-04 ENCOUNTER — Encounter: Payer: Self-pay | Admitting: Women's Health

## 2018-04-04 ENCOUNTER — Ambulatory Visit: Payer: BLUE CROSS/BLUE SHIELD | Admitting: Women's Health

## 2018-04-04 ENCOUNTER — Other Ambulatory Visit: Payer: Self-pay

## 2018-04-04 VITALS — BP 110/80

## 2018-04-04 DIAGNOSIS — B373 Candidiasis of vulva and vagina: Secondary | ICD-10-CM

## 2018-04-04 DIAGNOSIS — B3731 Acute candidiasis of vulva and vagina: Secondary | ICD-10-CM

## 2018-04-04 LAB — WET PREP FOR TRICH, YEAST, CLUE

## 2018-04-04 MED ORDER — FLUCONAZOLE 150 MG PO TABS: 150.0000 mg | ORAL_TABLET | Freq: Once | ORAL | 1 refills | Status: DC

## 2018-04-04 NOTE — Progress Notes (Signed)
58 year old D WF G3, P2 presents with complaint of vaginal itching for 3 days.  Denies vaginal odor, urinary symptoms, abdominal/back pain or fever.  Postmenopausal  with no bleeding.  Uses Premarin vaginal cream occasionally..  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  Not sexually active, denies need for STD screen.  Currently has a upper respiratory infection with mild cough and clear nasal drainage.  Exam: Appears well.  No CVAT.  Abdomen soft, nontender, external genitalia mild erythema at introitus, speculum exam scant white discharge with mild erythema to vaginal walls, wet prep positive for yeast, bimanual no CMT or adnexal tenderness.  Yeast vaginitis  Plan: Diflucan 150 p.o. x1 dose, prescription, proper use given and reviewed.  Instructed to call if no relief.  Yeast prevention discussed.  Reviewed possible seasonal allergies will try over-the-counter Zyrtec.

## 2018-04-04 NOTE — Patient Instructions (Signed)
Vaginal Yeast infection, Adult    Vaginal yeast infection is a condition that causes vaginal discharge as well as soreness, swelling, and redness (inflammation) of the vagina. This is a common condition. Some women get this infection frequently.  What are the causes?  This condition is caused by a change in the normal balance of the yeast (candida) and bacteria that live in the vagina. This change causes an overgrowth of yeast, which causes the inflammation.  What increases the risk?  The condition is more likely to develop in women who:   Take antibiotic medicines.   Have diabetes.   Take birth control pills.   Are pregnant.   Douche often.   Have a weak body defense system (immune system).   Have been taking steroid medicines for a long time.   Frequently wear tight clothing.  What are the signs or symptoms?  Symptoms of this condition include:   White, thick, creamy vaginal discharge.   Swelling, itching, redness, and irritation of the vagina. The lips of the vagina (vulva) may be affected as well.   Pain or a burning feeling while urinating.   Pain during sex.  How is this diagnosed?  This condition is diagnosed based on:   Your medical history.   A physical exam.   A pelvic exam. Your health care provider will examine a sample of your vaginal discharge under a microscope. Your health care provider may send this sample for testing to confirm the diagnosis.  How is this treated?  This condition is treated with medicine. Medicines may be over-the-counter or prescription. You may be told to use one or more of the following:   Medicine that is taken by mouth (orally).   Medicine that is applied as a cream (topically).   Medicine that is inserted directly into the vagina (suppository).  Follow these instructions at home:    Lifestyle   Do not have sex until your health care provider approves. Tell your sex partner that you have a yeast infection. That person should go to his or her health care  provider and ask if they should also be treated.   Do not wear tight clothes, such as pantyhose or tight pants.   Wear breathable cotton underwear.  General instructions   Take or apply over-the-counter and prescription medicines only as told by your health care provider.   Eat more yogurt. This may help to keep your yeast infection from returning.   Do not use tampons until your health care provider approves.   Try taking a sitz bath to help with discomfort. This is a warm water bath that is taken while you are sitting down. The water should only come up to your hips and should cover your buttocks. Do this 3-4 times per day or as told by your health care provider.   Do not douche.   If you have diabetes, keep your blood sugar levels under control.   Keep all follow-up visits as told by your health care provider. This is important.  Contact a health care provider if:   You have a fever.   Your symptoms go away and then return.   Your symptoms do not get better with treatment.   Your symptoms get worse.   You have new symptoms.   You develop blisters in or around your vagina.   You have blood coming from your vagina and it is not your menstrual period.   You develop pain in your abdomen.  Summary     Vaginal yeast infection is a condition that causes discharge as well as soreness, swelling, and redness (inflammation) of the vagina.   This condition is treated with medicine. Medicines may be over-the-counter or prescription.   Take or apply over-the-counter and prescription medicines only as told by your health care provider.   Do not douche. Do not have sex or use tampons until your health care provider approves.   Contact a health care provider if your symptoms do not get better with treatment or your symptoms go away and then return.  This information is not intended to replace advice given to you by your health care provider. Make sure you discuss any questions you have with your health care  provider.  Document Released: 10/21/2004 Document Revised: 05/30/2017 Document Reviewed: 05/30/2017  Elsevier Interactive Patient Education  2019 [employer]vier Inc[employer]

## 2018-04-26 ENCOUNTER — Other Ambulatory Visit: Payer: Self-pay | Admitting: Women's Health

## 2018-04-27 NOTE — Telephone Encounter (Signed)
Patient was seen and treated for yeast at [facility] on 04/04/18

## 2018-06-12 ENCOUNTER — Ambulatory Visit (INDEPENDENT_AMBULATORY_CARE_PROVIDER_SITE_OTHER): Payer: BLUE CROSS/BLUE SHIELD | Admitting: Psychology

## 2018-06-12 ENCOUNTER — Ambulatory Visit: Payer: Self-pay | Admitting: Psychology

## 2018-06-12 DIAGNOSIS — F4323 Adjustment disorder with mixed anxiety and depressed mood: Secondary | ICD-10-CM | POA: Diagnosis not present

## 2018-06-23 ENCOUNTER — Other Ambulatory Visit: Payer: Self-pay

## 2018-06-29 ENCOUNTER — Other Ambulatory Visit: Payer: Self-pay | Admitting: Obstetrics & Gynecology

## 2018-06-29 DIAGNOSIS — Z1231 Encounter for screening mammogram for malignant neoplasm of breast: Secondary | ICD-10-CM

## 2018-06-30 ENCOUNTER — Other Ambulatory Visit: Payer: Self-pay

## 2018-07-03 ENCOUNTER — Ambulatory Visit: Payer: BC Managed Care – PPO | Admitting: Obstetrics & Gynecology

## 2018-07-03 ENCOUNTER — Other Ambulatory Visit: Payer: Self-pay

## 2018-07-03 ENCOUNTER — Encounter: Payer: Self-pay | Admitting: Obstetrics & Gynecology

## 2018-07-03 VITALS — BP 104/76 | Ht 62.5 in | Wt 118.0 lb

## 2018-07-03 DIAGNOSIS — N952 Postmenopausal atrophic vaginitis: Secondary | ICD-10-CM

## 2018-07-03 DIAGNOSIS — Z780 Asymptomatic menopausal state: Secondary | ICD-10-CM

## 2018-07-03 DIAGNOSIS — Z01419 Encounter for gynecological examination (general) (routine) without abnormal findings: Secondary | ICD-10-CM

## 2018-07-03 DIAGNOSIS — R87610 Atypical squamous cells of undetermined significance on cytologic smear of cervix (ASC-US): Secondary | ICD-10-CM | POA: Diagnosis not present

## 2018-07-03 MED ORDER — ESTROGENS, CONJUGATED 0.625 MG/GM VA CREA: 0.2500 | TOPICAL_CREAM | VAGINAL | 5 refills | Status: DC

## 2018-07-03 NOTE — Patient Instructions (Signed)
  1. Encounter for routine gynecological examination with Papanicolaou smear of cervix Normal gynecologic exam in menopause.  Pap reflex done.  Breast exam normal.  Screening mammogram scheduled for July 2020.  Colonoscopy 2012.  Health labs with family physician.  Good body mass index at 21.24.  Increase physical activity for fitness.  2. ASCUS of cervix with negative high risk HPV Pap reflex done today.  3. Postmenopause Well on no hormone replacement therapy.  No postmenopausal bleeding.  Vitamin D supplements, calcium intake of 1200 mg daily and regular weightbearing physical activity is recommended.    4. Postmenopausal atrophic vaginitis Symptoms of dryness improved on Premarin cream.  No contraindication to continue.  Use a quarter of an applicator vaginally and small amount on the vulva twice a week.  Prescription sent to pharmacy.  Other orders - conjugated estrogens (PREMARIN) vaginal cream; Place 1.60 Applicatorfuls vaginally 2 (two) times a week.  Sarah Zhang, it was a pleasure seeing you today!  I will inform you of your results as soon as they are available.

## 2018-07-03 NOTE — Addendum Note (Signed)
Addended by: Thurnell Garbe A on: 07/03/2018 04:47 PM   Modules accepted: Orders

## 2018-07-03 NOTE — Progress Notes (Signed)
Sarah Zhang [birth year] [id number]   History:    58 y.o. 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   RP:  Established patient presenting for annual gyn exam   HPI: Postmenopause, well on no HRT.  No PMB.  No pelvic pain.  Abstinent.  Vaginal/vulvar dryness improved on Premarin cream.  Breasts normal.  BMI 21.24.  Was exercising regularly, less in the last few months with the confinement.  Had depressive Sx, better with psychotherapy.  Health labs with Fam MD.  Past medical history,surgical history, family history and social history were all reviewed and documented in the EPIC chart.  Gynecologic History Patient's last menstrual period was 06/20/2011. Contraception: post menopausal status Last Pap: 05/2017. Results were: ASCUS/HPV HR neg Last mammogram: 07/2017. Results were: Negative Bone Density: 06/2017 Normal Colonoscopy: 2012  Obstetric History OB History  Gravida Para Term Preterm AB Living  3 2     1 2   SAB TAB Ectopic Multiple Live Births  1            # Outcome Date GA Lbr Len/2nd Weight Sex Delivery Anes PTL Lv  3 SAB           2 Para           1 Para              ROS: A ROS was performed and pertinent positives and negatives are included in the history.  GENERAL: No fevers or chills. HEENT: No change in vision, no earache, sore throat or sinus congestion. NECK: No pain or stiffness. CARDIOVASCULAR: No chest pain or pressure. No palpitations. PULMONARY: No shortness of breath, cough or wheeze. GASTROINTESTINAL: No abdominal pain, nausea, vomiting or diarrhea, melena or bright red blood per rectum. GENITOURINARY: No urinary frequency, urgency, hesitancy or dysuria. MUSCULOSKELETAL: No joint or muscle pain, no back pain, no recent trauma. DERMATOLOGIC: No rash, no itching, no lesions. ENDOCRINE: No polyuria, polydipsia, no heat or cold intolerance. No recent change in weight. HEMATOLOGICAL: No anemia or easy bruising or bleeding. NEUROLOGIC:  No headache, seizures, numbness, tingling or weakness. PSYCHIATRIC: No depression, no loss of interest in normal activity or change in sleep pattern.     Exam:   BP 104/76   Ht 5' 2.5" (1.588 m)   Wt 118 lb (53.5 kg)   LMP 06/20/2011   BMI 21.24 kg/m   Body mass index is 21.24 kg/m.  General appearance : Well developed well nourished female. No acute distress HEENT: Eyes: no retinal hemorrhage or exudates,  Neck supple, trachea midline, no carotid bruits, no thyroidmegaly Lungs: Clear to auscultation, no rhonchi or wheezes, or rib retractions  Heart: Regular rate and rhythm, no murmurs or gallops Breast:Examined in sitting and supine position were symmetrical in appearance, no palpable masses or tenderness,  no skin retraction, no nipple inversion, no nipple discharge, no skin discoloration, no axillary or supraclavicular lymphadenopathy Abdomen: no palpable masses or tenderness, no rebound or guarding Extremities: no edema or skin discoloration or tenderness  Pelvic: Vulva: Normal             Vagina: No gross lesions or discharge  Cervix: No gross lesions or discharge.  Pap reflex done.  Uterus  AV, normal size, shape and consistency, non-tender and mobile  Adnexa  Without masses or tenderness  Anus: Normal   Assessment/Plan:  58 y.o. female for annual exam   1. Encounter for routine gynecological examination with Papanicolaou smear of cervix Normal  gynecologic exam in menopause.  Pap reflex done.  Breast exam normal.  Screening mammogram scheduled for July 2020.  Colonoscopy 2012.  Health labs with family physician.  Good body mass index at 21.24.  Increase physical activity for fitness.  2. ASCUS of cervix with negative high risk HPV Pap reflex done today.  3. Postmenopause Well on no hormone replacement therapy.  No postmenopausal bleeding.  Vitamin D supplements, calcium intake of 1200 mg daily and regular weightbearing physical activity is recommended.    4.  Postmenopausal atrophic vaginitis Symptoms of dryness improved on Premarin cream.  No contraindication to continue.  Use a quarter of an applicator vaginally and small amount on the vulva twice a week.  Prescription sent to pharmacy.  Other orders - conjugated estrogens (PREMARIN) vaginal cream; Place 0.25 Applicatorfuls vaginally 2 (two) times a week.  Genia DelMarie-Lyne Satya Bohall MD, 4:14 PM 07/03/2018

## 2018-07-05 LAB — PAP IG W/ RFLX HPV ASCU

## 2018-07-12 ENCOUNTER — Ambulatory Visit (INDEPENDENT_AMBULATORY_CARE_PROVIDER_SITE_OTHER): Payer: BC Managed Care – PPO | Admitting: Psychology

## 2018-07-12 DIAGNOSIS — F4323 Adjustment disorder with mixed anxiety and depressed mood: Secondary | ICD-10-CM

## 2018-07-20 ENCOUNTER — Ambulatory Visit (INDEPENDENT_AMBULATORY_CARE_PROVIDER_SITE_OTHER): Payer: BC Managed Care – PPO | Admitting: Psychology

## 2018-07-20 DIAGNOSIS — F4323 Adjustment disorder with mixed anxiety and depressed mood: Secondary | ICD-10-CM

## 2018-08-03 DIAGNOSIS — F411 Generalized anxiety disorder: Secondary | ICD-10-CM | POA: Diagnosis not present

## 2018-08-03 DIAGNOSIS — E039 Hypothyroidism, unspecified: Secondary | ICD-10-CM | POA: Diagnosis not present

## 2018-08-03 DIAGNOSIS — G4700 Insomnia, unspecified: Secondary | ICD-10-CM | POA: Diagnosis not present

## 2018-08-16 ENCOUNTER — Ambulatory Visit
Admission: RE | Admit: 2018-08-16 | Discharge: 2018-08-16 | Disposition: A | Discharge status: Home / Self Care | Payer: BC Managed Care – PPO | Source: Ambulatory Visit | Attending: Obstetrics & Gynecology | Admitting: Obstetrics & Gynecology

## 2018-08-16 ENCOUNTER — Other Ambulatory Visit: Payer: Self-pay

## 2018-08-16 DIAGNOSIS — Z1231 Encounter for screening mammogram for malignant neoplasm of breast: Secondary | ICD-10-CM | POA: Diagnosis not present

## 2018-09-14 ENCOUNTER — Ambulatory Visit (INDEPENDENT_AMBULATORY_CARE_PROVIDER_SITE_OTHER): Payer: BC Managed Care – PPO | Admitting: Psychology

## 2018-09-14 DIAGNOSIS — F4323 Adjustment disorder with mixed anxiety and depressed mood: Secondary | ICD-10-CM

## 2018-09-22 ENCOUNTER — Ambulatory Visit: Payer: BLUE CROSS/BLUE SHIELD | Admitting: Neurology

## 2018-09-25 DIAGNOSIS — H11152 Pinguecula, left eye: Secondary | ICD-10-CM | POA: Diagnosis not present

## 2018-09-29 ENCOUNTER — Ambulatory Visit: Payer: BLUE CROSS/BLUE SHIELD | Admitting: Neurology

## 2018-10-24 ENCOUNTER — Encounter: Payer: Self-pay | Admitting: Gynecology

## 2018-12-09 DIAGNOSIS — J069 Acute upper respiratory infection, unspecified: Secondary | ICD-10-CM | POA: Diagnosis not present

## 2018-12-09 DIAGNOSIS — R05 Cough: Secondary | ICD-10-CM | POA: Diagnosis not present

## 2018-12-09 DIAGNOSIS — Z2089 Contact with and (suspected) exposure to other communicable diseases: Secondary | ICD-10-CM | POA: Diagnosis not present

## 2018-12-10 DIAGNOSIS — R05 Cough: Secondary | ICD-10-CM | POA: Diagnosis not present

## 2018-12-12 DIAGNOSIS — J069 Acute upper respiratory infection, unspecified: Secondary | ICD-10-CM | POA: Diagnosis not present

## 2018-12-12 DIAGNOSIS — G4700 Insomnia, unspecified: Secondary | ICD-10-CM | POA: Diagnosis not present

## 2018-12-19 ENCOUNTER — Telehealth: Payer: BC Managed Care – PPO | Admitting: Neurology

## 2019-02-03 ENCOUNTER — Other Ambulatory Visit: Payer: Self-pay

## 2019-02-03 DIAGNOSIS — Z20822 Contact with and (suspected) exposure to covid-19: Secondary | ICD-10-CM

## 2019-02-04 LAB — NOVEL CORONAVIRUS, NAA: SARS-CoV-2, NAA: NOT DETECTED

## 2019-02-09 DIAGNOSIS — E039 Hypothyroidism, unspecified: Secondary | ICD-10-CM | POA: Diagnosis not present

## 2019-02-09 DIAGNOSIS — G4700 Insomnia, unspecified: Secondary | ICD-10-CM | POA: Diagnosis not present

## 2019-02-09 DIAGNOSIS — Z0000 Encounter for general adult medical examination without abnormal findings: Secondary | ICD-10-CM | POA: Diagnosis not present

## 2019-02-09 DIAGNOSIS — R61 Generalized hyperhidrosis: Secondary | ICD-10-CM | POA: Diagnosis not present

## 2019-02-26 DIAGNOSIS — R61 Generalized hyperhidrosis: Secondary | ICD-10-CM | POA: Diagnosis not present

## 2019-02-26 DIAGNOSIS — Z0000 Encounter for general adult medical examination without abnormal findings: Secondary | ICD-10-CM | POA: Diagnosis not present

## 2019-04-13 ENCOUNTER — Other Ambulatory Visit: Payer: Self-pay

## 2019-04-16 ENCOUNTER — Other Ambulatory Visit: Payer: Self-pay

## 2019-04-16 ENCOUNTER — Ambulatory Visit (INDEPENDENT_AMBULATORY_CARE_PROVIDER_SITE_OTHER): Payer: 59 | Admitting: Obstetrics & Gynecology

## 2019-04-16 ENCOUNTER — Encounter: Payer: Self-pay | Admitting: Obstetrics & Gynecology

## 2019-04-16 VITALS — BP 118/74

## 2019-04-16 DIAGNOSIS — L918 Other hypertrophic disorders of the skin: Secondary | ICD-10-CM | POA: Diagnosis not present

## 2019-04-16 DIAGNOSIS — Z780 Asymptomatic menopausal state: Secondary | ICD-10-CM

## 2019-04-16 DIAGNOSIS — G4700 Insomnia, unspecified: Secondary | ICD-10-CM

## 2019-04-16 NOTE — Progress Notes (Signed)
    Sarah Zhang 08/25/1960 [id number]        59 y.o.  [id number]   RP: Bump on vulva.  Hot flushes.  Insomnia.  HPI: Very small bump on left vulva, worried that it may be a wart.  Postmenopause, was well on no HRT, but now having some hot flushes and insomnia.  No PMB.  No pelvic pain.  Abstinent.  Vaginal/vulvar dryness improved on Premarin cream.     OB History  Gravida Para Term Preterm AB Living  3 2     1 2   SAB TAB Ectopic Multiple Live Births  1            # Outcome Date GA Lbr Len/2nd Weight Sex Delivery Anes PTL Lv  3 SAB           2 Para           1 Para             Past medical history,surgical history, problem list, medications, allergies, family history and social history were all reviewed and documented in the EPIC chart.   Directed ROS with pertinent positives and negatives documented in the history of present illness/assessment and plan.  Exam:  Vitals:   04/16/19 1558  BP: 118/74   General appearance:  Normal    Gynecologic exam: Vulva:  Very small skin tag at left inferior perineum towards perianal area.    Physical Exam Genitourinary:       Assessment/Plan:  59 y.o. 41   1. Skin tag Very small skin tag.  Patient reassured.  Will recheck at Annual/Gyn visit in 06/2019.  Will remove if changing.  2. Postmenopause Mild increase in symptoms of menopause, but prefers not to start on HRT.  Will try Black Cohosh and Soy products in nutrition.  3. Insomnia, unspecified type Counseling on insomnia done.  Recommend increasing physical activity to aerobic activities 5 times a week and light weight lifting every 2 days.  Herb tea such as Sleepy time as needed.    07/2019 MD, 4:33 PM 04/16/2019

## 2019-04-20 ENCOUNTER — Encounter: Payer: Self-pay | Admitting: Obstetrics & Gynecology

## 2019-04-20 NOTE — Patient Instructions (Signed)
1. Skin tag Very small skin tag.  Patient reassured.  Will recheck at Annual/Gyn visit in 06/2019.  Will remove if changing.  2. Postmenopause Mild increase in symptoms of menopause, but prefers not to start on HRT.  Will try Black Cohosh and Soy products in nutrition.  3. Insomnia, unspecified type Counseling on insomnia done.  Recommend increasing physical activity to aerobic activities 5 times a week and light weight lifting every 2 days.  Herb tea such as Sleepy time as needed.    Sarah Zhang, it was a pleasure seeing you today!

## 2019-05-02 ENCOUNTER — Ambulatory Visit: Payer: 59 | Admitting: Obstetrics & Gynecology

## 2019-05-02 ENCOUNTER — Encounter: Payer: Self-pay | Admitting: Obstetrics & Gynecology

## 2019-05-02 ENCOUNTER — Other Ambulatory Visit: Payer: Self-pay

## 2019-05-02 VITALS — BP 120/70

## 2019-05-02 DIAGNOSIS — N644 Mastodynia: Secondary | ICD-10-CM | POA: Diagnosis not present

## 2019-05-02 NOTE — Patient Instructions (Signed)
1. Pain of right breast Right lateral breast pain after hitting a piece of furniture a week ago.  Patient has bilateral silicone implants inserted 12 years ago.  Bilateral screening mammogram negative in July 2020.  Was concerned that the right implant might be leaking after that trauma a week ago.  Bilateral breast exam with implants normal today.  The right implant in particular feels full and smooth with no evidence of leakage.  No nodule or mass felt.  Patient reassured.  Will evaluate again in 2 months at her annual gynecologic exam in June 2021.  Caran, it was a pleasure seeing you today!

## 2019-05-02 NOTE — Progress Notes (Signed)
    Sarah Zhang September 25, 1960 [id number]        60 y.o.  [id number]   RP: Rt breast pain post trauma x 1 week   HPI: S/P bilateral Silicone breast implants 12 years ago.  Last screening Mammo Negative 07/2018.  Hit her Rt lateral breast on furniture while on vacation 1 week ago.  Tenderness/pain at site of trauma, still present, but decreasing since then.  Worried that her Silicone implant may be leaking as the hit was strong.  No change in the shape of her Rt breast.  No lump felt.  No bruising.  No redness.  No fever.   OB History  Gravida Para Term Preterm AB Living  3 2     1 2   SAB TAB Ectopic Multiple Live Births  1            # Outcome Date GA Lbr Len/2nd Weight Sex Delivery Anes PTL Lv  3 SAB           2 Para           1 Para             Past medical history,surgical history, problem list, medications, allergies, family history and social history were all reviewed and documented in the EPIC chart.   Directed ROS with pertinent positives and negatives documented in the history of present illness/assessment and plan.  Exam:  Vitals:   05/02/19 1601  BP: 120/70   General appearance:  Normal  Breast exam:  Left breast:  Implant intact, no nodule or mass felt, NT.  Skin normal.  No left axillary LN.                           Right breast:  Implant intact, no nodule or mass felt, NT.  Skin normal. No right axillary LN.   Assessment/Plan:  59 y.o. 41   1. Pain of right breast Right lateral breast pain after hitting a piece of furniture a week ago.  Patient has bilateral silicone implants inserted 12 years ago.  Bilateral screening mammogram negative in July 2020.  Was concerned that the right implant might be leaking after that trauma a week ago.  Bilateral breast exam with implants normal today.  The right implant in particular feels full and smooth with no evidence of leakage.  No nodule or mass felt.  Patient reassured.  Will evaluate again in 2 months at her  annual gynecologic exam in June 2021.  July 2021 MD, 4:06 PM 05/02/2019

## 2019-07-04 ENCOUNTER — Other Ambulatory Visit: Payer: Self-pay

## 2019-07-05 ENCOUNTER — Ambulatory Visit (INDEPENDENT_AMBULATORY_CARE_PROVIDER_SITE_OTHER): Payer: 59 | Admitting: Obstetrics & Gynecology

## 2019-07-05 ENCOUNTER — Encounter: Payer: Self-pay | Admitting: Obstetrics & Gynecology

## 2019-07-05 VITALS — BP 110/68 | Ht 62.5 in | Wt 120.0 lb

## 2019-07-05 DIAGNOSIS — B3731 Acute candidiasis of vulva and vagina: Secondary | ICD-10-CM

## 2019-07-05 DIAGNOSIS — Z01419 Encounter for gynecological examination (general) (routine) without abnormal findings: Secondary | ICD-10-CM | POA: Diagnosis not present

## 2019-07-05 DIAGNOSIS — B373 Candidiasis of vulva and vagina: Secondary | ICD-10-CM

## 2019-07-05 DIAGNOSIS — N952 Postmenopausal atrophic vaginitis: Secondary | ICD-10-CM

## 2019-07-05 DIAGNOSIS — Z113 Encounter for screening for infections with a predominantly sexual mode of transmission: Secondary | ICD-10-CM | POA: Diagnosis not present

## 2019-07-05 DIAGNOSIS — Z1151 Encounter for screening for human papillomavirus (HPV): Secondary | ICD-10-CM

## 2019-07-05 DIAGNOSIS — Z780 Asymptomatic menopausal state: Secondary | ICD-10-CM | POA: Diagnosis not present

## 2019-07-05 MED ORDER — PREMARIN 0.625 MG/GM VA CREA: 0.2500 | TOPICAL_CREAM | VAGINAL | 5 refills | Status: DC

## 2019-07-05 MED ORDER — FLUCONAZOLE 150 MG PO TABS
150.0000 mg | ORAL_TABLET | Freq: Every day | ORAL | 3 refills | Status: AC
Start: 2019-07-05 — End: 2019-07-08

## 2019-07-05 NOTE — Progress Notes (Addendum)
Sarah Zhang March 08, 1960 [id number]   History:    59 y.o. 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   RP:  Established patient presenting for annual gyn exam   HPI: Postmenopause, well on no HRT.  No PMB.  No pelvic pain.  No pain with IC.  Well on Premarin cream.  Using condoms.  Vaginal/vulvar dryness improved on Premarin cream.  Breasts normal.  BMI 21.6.  Exercising regularly.  No depressive Sx.  Health labs with Fam MD.   Past medical history,surgical history, family history and social history were all reviewed and documented in the EPIC chart.  Gynecologic History Patient's last menstrual period was 06/20/2011.  Obstetric History OB History  Gravida Para Term Preterm AB Living  3 2     1 2   SAB TAB Ectopic Multiple Live Births  1            # Outcome Date GA Lbr Len/2nd Weight Sex Delivery Anes PTL Lv  3 SAB           2 Para           1 Para              ROS: A ROS was performed and pertinent positives and negatives are included in the history.  GENERAL: No fevers or chills. HEENT: No change in vision, no earache, sore throat or sinus congestion. NECK: No pain or stiffness. CARDIOVASCULAR: No chest pain or pressure. No palpitations. PULMONARY: No shortness of breath, cough or wheeze. GASTROINTESTINAL: No abdominal pain, nausea, vomiting or diarrhea, melena or bright red blood per rectum. GENITOURINARY: No urinary frequency, urgency, hesitancy or dysuria. MUSCULOSKELETAL: No joint or muscle pain, no back pain, no recent trauma. DERMATOLOGIC: No rash, no itching, no lesions. ENDOCRINE: No polyuria, polydipsia, no heat or cold intolerance. No recent change in weight. HEMATOLOGICAL: No anemia or easy bruising or bleeding. NEUROLOGIC: No headache, seizures, numbness, tingling or weakness. PSYCHIATRIC: No depression, no loss of interest in normal activity or change in sleep pattern.     Exam:   BP 110/68   Ht 5' 2.5" (1.588 m)   Wt  120 lb (54.4 kg)   LMP 06/20/2011   BMI 21.60 kg/m   Body mass index is 21.6 kg/m.  General appearance : Well developed well nourished female. No acute distress HEENT: Eyes: no retinal hemorrhage or exudates,  Neck supple, trachea midline, no carotid bruits, no thyroidmegaly Lungs: Clear to auscultation, no rhonchi or wheezes, or rib retractions  Heart: Regular rate and rhythm, no murmurs or gallops Breast:Examined in sitting and supine position were symmetrical in appearance, no palpable masses or tenderness,  no skin retraction, no nipple inversion, no nipple discharge, no skin discoloration, no axillary or supraclavicular lymphadenopathy Abdomen: no palpable masses or tenderness, no rebound or guarding Extremities: no edema or skin discoloration or tenderness  Pelvic: Vulva: Normal             Vagina: No gross lesions or discharge  Cervix: No gross lesions or discharge.  Pap/HPV HR, Gono-Chlam done.  Uterus  AV, normal size, shape and consistency, non-tender and mobile  Adnexa  Without masses or tenderness  Anus: Normal   Assessment/Plan:  59 y.o. female for annual exam   1. Encounter for routine gynecological examination with Papanicolaou smear of cervix .  Normal gynecologic exam.  Pap test with high-risk HPV done.  Breast exam normal.  Screening mammogram July 2020 Negative.  Colonoscopy 2012.  Health labs with family physician.  Body mass index good at 21.6.  Continue with fitness and healthy nutrition.  2. Postmenopause Well on no systemic HRT.  No PMB.  Bone Density normal in 06/2017.  3. Postmenopausal atrophic vaginitis Well on Premarin cream.  No contraindication.  Represcribed.  4. Screen for STD (sexually transmitted disease) Strict Condom use recommended. - Gono-Chlam on Pap - HIV antibody (with reflex) - RPR - Hepatitis C Antibody - Hepatitis B Surface AntiGEN  5. Yeast vaginitis Recurrent Yeast vaginitis.  Fluconazole prescribed.  Other orders -  conjugated estrogens (PREMARIN) vaginal cream; Place 0.25 Applicatorfuls vaginally 2 (two) times a week. - fluconazole (DIFLUCAN) 150 MG tablet; Take 1 tablet (150 mg total) by mouth daily for 3 days.  [name] MD, 4:05 PM 07/05/2019

## 2019-07-06 LAB — HEPATITIS C ANTIBODY
Hepatitis C Ab: NONREACTIVE
SIGNAL TO CUT-OFF: 0.01 (ref <1.00)

## 2019-07-06 LAB — RPR: RPR Ser Ql: NONREACTIVE

## 2019-07-06 LAB — HEPATITIS B SURFACE ANTIGEN: Hepatitis B Surface Ag: NONREACTIVE

## 2019-07-06 LAB — HIV ANTIBODY (ROUTINE TESTING W REFLEX): HIV 1&2 Ab, 4th Generation: NONREACTIVE

## 2019-07-08 ENCOUNTER — Encounter: Payer: Self-pay | Admitting: Obstetrics & Gynecology

## 2019-07-08 NOTE — Patient Instructions (Signed)
1. Encounter for routine gynecological examination with Papanicolaou smear of cervix .  Normal gynecologic exam.  Pap test with high-risk HPV done.  Breast exam normal.  Screening mammogram July 2020 Negative.  Colonoscopy 2012.  Health labs with family physician.  Body mass index good at 21.6.  Continue with fitness and healthy nutrition.  2. Postmenopause Well on no systemic HRT.  No PMB.  Bone Density normal in 06/2017.  3. Postmenopausal atrophic vaginitis Well on Premarin cream.  No contraindication.  Represcribed.  4. Screen for STD (sexually transmitted disease) Strict Condom use recommended. - Gono-Chlam on Pap - HIV antibody (with reflex) - RPR - Hepatitis C Antibody - Hepatitis B Surface AntiGEN  5. Yeast vaginitis Recurrent Yeast vaginitis.  Fluconazole prescribed.  Other orders - conjugated estrogens (PREMARIN) vaginal cream; Place 0.25 Applicatorfuls vaginally 2 (two) times a week. - fluconazole (DIFLUCAN) 150 MG tablet; Take 1 tablet (150 mg total) by mouth daily for 3 days.  Season, it was a pleasure seeing you today!

## 2019-07-10 LAB — PAP IG, CT-NG NAA, HPV HIGH-RISK
C. trachomatis RNA, TMA: NOT DETECTED
HPV DNA High Risk: NOT DETECTED
N. gonorrhoeae RNA, TMA: NOT DETECTED

## 2019-07-13 ENCOUNTER — Other Ambulatory Visit: Payer: Self-pay | Admitting: Obstetrics & Gynecology

## 2019-07-13 DIAGNOSIS — Z1231 Encounter for screening mammogram for malignant neoplasm of breast: Secondary | ICD-10-CM

## 2019-07-17 ENCOUNTER — Other Ambulatory Visit: Payer: Self-pay

## 2019-07-17 ENCOUNTER — Encounter: Payer: Self-pay | Admitting: Neurology

## 2019-07-17 ENCOUNTER — Telehealth (INDEPENDENT_AMBULATORY_CARE_PROVIDER_SITE_OTHER): Payer: Self-pay | Admitting: Neurology

## 2019-07-17 DIAGNOSIS — Z91199 Patient's noncompliance with other medical treatment and regimen due to unspecified reason: Secondary | ICD-10-CM

## 2019-07-17 DIAGNOSIS — Z5329 Procedure and treatment not carried out because of patient's decision for other reasons: Secondary | ICD-10-CM

## 2019-08-04 NOTE — Progress Notes (Signed)
Patient cancelled appointment.

## 2019-08-17 ENCOUNTER — Ambulatory Visit
Admission: RE | Admit: 2019-08-17 | Discharge: 2019-08-17 | Disposition: A | Discharge status: Home / Self Care | Payer: 59 | Source: Ambulatory Visit | Attending: Obstetrics & Gynecology | Admitting: Obstetrics & Gynecology

## 2019-08-17 ENCOUNTER — Other Ambulatory Visit: Payer: Self-pay

## 2019-08-17 DIAGNOSIS — Z1231 Encounter for screening mammogram for malignant neoplasm of breast: Secondary | ICD-10-CM

## 2019-08-23 ENCOUNTER — Encounter (HOSPITAL_COMMUNITY): Payer: Self-pay

## 2019-08-23 ENCOUNTER — Other Ambulatory Visit: Payer: Self-pay

## 2019-08-23 ENCOUNTER — Emergency Department (HOSPITAL_COMMUNITY)
Admission: EM | Admit: 2019-08-23 | Discharge: 2019-08-23 | Disposition: A | Discharge status: Home / Self Care | Payer: 59 | Attending: Emergency Medicine | Admitting: Emergency Medicine

## 2019-08-23 DIAGNOSIS — L252 Unspecified contact dermatitis due to dyes: Secondary | ICD-10-CM | POA: Insufficient documentation

## 2019-08-23 DIAGNOSIS — H5789 Other specified disorders of eye and adnexa: Secondary | ICD-10-CM | POA: Insufficient documentation

## 2019-08-23 DIAGNOSIS — Z5321 Procedure and treatment not carried out due to patient leaving prior to being seen by health care provider: Secondary | ICD-10-CM | POA: Insufficient documentation

## 2019-08-23 HISTORY — DX: Gastro-esophageal reflux disease without esophagitis: K21.9

## 2019-08-23 HISTORY — DX: Disorder of thyroid, unspecified: E07.9

## 2019-08-23 NOTE — ED Triage Notes (Signed)
Pt sts getting hair dye in bilateral eyes. Redness present.

## 2019-08-23 NOTE — ED Notes (Signed)
Pt eloped from waiting area. Called 3X.  

## 2020-07-07 ENCOUNTER — Other Ambulatory Visit: Payer: Self-pay | Admitting: Family Medicine

## 2020-07-07 ENCOUNTER — Encounter: Payer: 59 | Admitting: Obstetrics & Gynecology

## 2020-07-07 DIAGNOSIS — Z1231 Encounter for screening mammogram for malignant neoplasm of breast: Secondary | ICD-10-CM

## 2020-07-21 ENCOUNTER — Other Ambulatory Visit: Payer: Self-pay

## 2020-07-21 ENCOUNTER — Other Ambulatory Visit (HOSPITAL_COMMUNITY)
Admission: RE | Admit: 2020-07-21 | Discharge: 2020-07-21 | Disposition: A | Discharge status: Home / Self Care | Payer: 59 | Source: Ambulatory Visit | Attending: Obstetrics & Gynecology | Admitting: Obstetrics & Gynecology

## 2020-07-21 ENCOUNTER — Encounter: Payer: Self-pay | Admitting: Obstetrics & Gynecology

## 2020-07-21 ENCOUNTER — Ambulatory Visit (INDEPENDENT_AMBULATORY_CARE_PROVIDER_SITE_OTHER): Payer: 59 | Admitting: Obstetrics & Gynecology

## 2020-07-21 VITALS — BP 104/68 | HR 88 | Resp 16 | Ht 62.25 in | Wt 118.0 lb

## 2020-07-21 DIAGNOSIS — N952 Postmenopausal atrophic vaginitis: Secondary | ICD-10-CM

## 2020-07-21 DIAGNOSIS — R350 Frequency of micturition: Secondary | ICD-10-CM | POA: Diagnosis not present

## 2020-07-21 DIAGNOSIS — Z01419 Encounter for gynecological examination (general) (routine) without abnormal findings: Secondary | ICD-10-CM

## 2020-07-21 DIAGNOSIS — Z780 Asymptomatic menopausal state: Secondary | ICD-10-CM

## 2020-07-21 DIAGNOSIS — N898 Other specified noninflammatory disorders of vagina: Secondary | ICD-10-CM

## 2020-07-21 LAB — URINALYSIS, COMPLETE W/RFL CULTURE
Bacteria, UA: NONE SEEN /HPF
Bilirubin Urine: NEGATIVE
Glucose, UA: NEGATIVE
Hgb urine dipstick: NEGATIVE
Hyaline Cast: NONE SEEN /LPF
Ketones, ur: NEGATIVE
Leukocyte Esterase: NEGATIVE
Nitrites, Initial: NEGATIVE
Protein, ur: NEGATIVE
RBC / HPF: NONE SEEN /HPF (ref 0–2)
Specific Gravity, Urine: 1.02 (ref 1.001–1.035)
WBC, UA: NONE SEEN /HPF (ref 0–5)
pH: 5.5 (ref 5.0–8.0)

## 2020-07-21 LAB — WET PREP FOR TRICH, YEAST, CLUE

## 2020-07-21 LAB — NO CULTURE INDICATED

## 2020-07-21 MED ORDER — PREMARIN 0.625 MG/GM VA CREA: 0.2500 | TOPICAL_CREAM | VAGINAL | 5 refills | Status: DC

## 2020-07-21 MED ORDER — FLUCONAZOLE 150 MG PO TABS: 150.0000 mg | ORAL_TABLET | Freq: Every day | ORAL | 2 refills | Status: AC

## 2020-07-21 NOTE — Progress Notes (Signed)
Sarah Zhang December 29, 1960 [id number]   History:    60 y.o. G3P2A1L2 Divorced.  Teaching at [employer].  2 daughters have children.   RP:  Established patient presenting for annual gyn exam   HPI: Postmenopause, well on no HRT.  No PMB.  No pelvic pain.  No pain with IC using Premarin cream.  Vaginal itching.  Urinary frequency.  Currently abstinent.  Breasts normal.  BMI 21.41.  Exercising regularly.  Health labs with Fam MD.  Past medical history,surgical history, family history and social history were all reviewed and documented in the EPIC chart.  Gynecologic History Patient's last menstrual period was 06/20/2011.  Obstetric History OB History  Gravida Para Term Preterm AB Living  3 2     1 2   SAB IAB Ectopic Multiple Live Births  1            # Outcome Date GA Lbr Len/2nd Weight Sex Delivery Anes PTL Lv  3 SAB           2 Para           1 Para              ROS: A ROS was performed and pertinent positives and negatives are included in the history.  GENERAL: No fevers or chills. HEENT: No change in vision, no earache, sore throat or sinus congestion. NECK: No pain or stiffness. CARDIOVASCULAR: No chest pain or pressure. No palpitations. PULMONARY: No shortness of breath, cough or wheeze. GASTROINTESTINAL: No abdominal pain, nausea, vomiting or diarrhea, melena or bright red blood per rectum. GENITOURINARY: No urinary frequency, urgency, hesitancy or dysuria. MUSCULOSKELETAL: No joint or muscle pain, no back pain, no recent trauma. DERMATOLOGIC: No rash, no itching, no lesions. ENDOCRINE: No polyuria, polydipsia, no heat or cold intolerance. No recent change in weight. HEMATOLOGICAL: No anemia or easy bruising or bleeding. NEUROLOGIC: No headache, seizures, numbness, tingling or weakness. PSYCHIATRIC: No depression, no loss of interest in normal activity or change in sleep pattern.     Exam:   BP 104/68   Pulse 88   Resp 16   Ht 5' 2.25" (1.581 m)   Wt 118 lb  (53.5 kg)   LMP 06/20/2011   BMI 21.41 kg/m   Body mass index is 21.41 kg/m.  General appearance : Well developed well nourished female. No acute distress HEENT: Eyes: no retinal hemorrhage or exudates,  Neck supple, trachea midline, no carotid bruits, no thyroidmegaly Lungs: Clear to auscultation, no rhonchi or wheezes, or rib retractions  Heart: Regular rate and rhythm, no murmurs or gallops Breast:Examined in sitting and supine position were symmetrical in appearance, no palpable masses or tenderness,  no skin retraction, no nipple inversion, no nipple discharge, no skin discoloration, no axillary or supraclavicular lymphadenopathy Abdomen: no palpable masses or tenderness, no rebound or guarding Extremities: no edema or skin discoloration or tenderness  Declines chaperone for gynecologic exam. Pelvic: Vulva: Normal             Vagina: No gross lesions or discharge.  Wet prep done.  Cervix: No gross lesions or discharge.  Pap reflex done.  Uterus  AV, normal size, shape and consistency, non-tender and mobile  Adnexa  Without masses or tenderness  Anus: Normal  U/A Negative Wet prep: Yeasts present   Assessment/Plan:  60 y.o. female for annual exam   1. Encounter for routine gynecological examination with Papanicolaou smear of cervix Normal gynecologic exam in menopause.  Pap reflex done.  Breast exam normal.  Screening mammogram scheduled in August 2022.  We will schedule a colonoscopy this year.  Health labs with family physician.  Good body mass index at 21.41.  Continue with fitness and healthy nutrition. - Cytology - PAP( [facility])  2. Postmenopause Well on no systemic hormone replacement therapy.  No postmenopausal bleeding.  Last bone density in 2019 was normal, will repeat at age 78.  3. Postmenopausal atrophic vaginitis Well on Premarin cream twice a week.  No contraindication to continue.  Prescription sent to pharmacy.  4. Urinary frequency Urine analysis  completely negative.  Patient reassured. - Urinalysis,Complete w/RFL Culture  5. Vagina itching Yeast vaginitis confirmed by wet prep.  Decision to treat with fluconazole.  Usage reviewed and prescription sent to pharmacy. - WET PREP FOR TRICH, YEAST, CLUE  Other orders - conjugated estrogens (PREMARIN) vaginal cream; Place 0.25 Applicatorfuls vaginally 2 (two) times a week. - fluconazole (DIFLUCAN) 150 MG tablet; Take 1 tablet (150 mg total) by mouth daily for 3 days.   [name] MD, 9:24 AM 07/21/2020

## 2020-07-21 NOTE — Patient Instructions (Signed)
Colpo right

## 2020-07-22 LAB — CYTOLOGY - PAP: Diagnosis: NEGATIVE

## 2020-08-01 ENCOUNTER — Encounter: Payer: Self-pay | Admitting: Internal Medicine

## 2020-09-01 ENCOUNTER — Ambulatory Visit
Admission: RE | Admit: 2020-09-01 | Discharge: 2020-09-01 | Disposition: A | Discharge status: Home / Self Care | Payer: 59 | Source: Ambulatory Visit | Attending: Family Medicine | Admitting: Family Medicine

## 2020-09-01 ENCOUNTER — Other Ambulatory Visit: Payer: Self-pay

## 2020-09-01 DIAGNOSIS — Z1231 Encounter for screening mammogram for malignant neoplasm of breast: Secondary | ICD-10-CM

## 2020-10-21 ENCOUNTER — Encounter: Payer: Self-pay | Admitting: Internal Medicine

## 2020-10-21 ENCOUNTER — Ambulatory Visit: Payer: 59 | Admitting: Internal Medicine

## 2020-10-21 VITALS — BP 100/60 | HR 104 | Ht 62.0 in | Wt 116.1 lb

## 2020-10-21 DIAGNOSIS — K219 Gastro-esophageal reflux disease without esophagitis: Secondary | ICD-10-CM | POA: Diagnosis not present

## 2020-10-21 DIAGNOSIS — Z1211 Encounter for screening for malignant neoplasm of colon: Secondary | ICD-10-CM | POA: Diagnosis not present

## 2020-10-21 NOTE — Progress Notes (Signed)
Sarah Zhang 60 y.o. [birth year] [id number]  Assessment & Plan:   Encounter Diagnoses  Name Primary?   Gastroesophageal reflux disease, unspecified whether esophagitis present Yes   Colon cancer screening     Try to taper off PPI - we reviewed that these medications are overall very safe but worth getting to lowest effective dose. She did not want to try 20 mg omeprazole due to $10 co-pay for OTC 20 mg yet she is concerned "about my bones"  Colonoscopy The risks and benefits as well as alternatives of endoscopic procedure(s) have been discussed and reviewed. All questions answered. The patient agrees to proceed.   CC: Farris Has, MD  Subjective:   Chief Complaint: Colon cancer screening questions about GERD and PPI  HPI 60 year old Hispanic woman known to me from prior negative colonoscopy in 2012 performed for screening.  She also takes a PPI, on omeprazole 40 mg daily with an indication of GERD.  She thinks she had an EGD in [place] when she was living down there in 2015 and had a hiatal hernia.  Not seen on my EGD of 2012.  Then she is not sure she had an EGD but she was placed on PPI around that time she believes.  She wonders if she can come off it due to "bad things I read about these medications". Has been off PPI x 3 d now (almost out) w/ some sorness in epigastrium Waiting on a refill  She does not have dysphagia.  Wt Readings from Last 3 Encounters:  10/21/20 116 lb 2 oz (52.7 kg)  07/21/20 118 lb (53.5 kg)  07/05/19 120 lb (54.4 kg)   Bowel habits are regular without change.  EGD 2012 question scalloped folds in the duodenum normal biopsies Colonoscopy 2012-mild diverticulosis left colon otherwise normal started on dicyclomine Allergies  Allergen Reactions   Codeine    Sulfonamide Derivatives    Current Meds  Medication Sig   B Complex Vitamins (VITAMIN-B COMPLEX PO) Take 1 capsule by mouth daily.   CALCIUM PO Take by mouth.      Cholecalciferol (VITAMIN D PO) Take by mouth.     fish oil-omega-3 fatty acids 1000 MG capsule Take 1 g by mouth daily.     levothyroxine (SYNTHROID, LEVOTHROID) 88 MCG tablet    LORazepam (ATIVAN) 2 MG tablet Take 1 tablet (2 mg total) by mouth at bedtime as needed for anxiety.   Multiple Vitamin (MULTIVITAMIN) capsule Take 1 capsule by mouth daily.     omeprazole (PRILOSEC) 40 MG capsule Take 40 mg by mouth daily.   valACYclovir (VALTREX) 500 MG tablet TAKE 4 TABLETS TWICE DAILY   vitamin E 400 UNIT capsule Take 400 Units by mouth daily.     Past Medical History:  Diagnosis Date   Anxiety    Diverticulosis    GERD (gastroesophageal reflux disease)    Hiatal hernia    Hypothyroidism    Insomnia    Osteoarthritis    Scoliosis    Past Surgical History:  Procedure Laterality Date   APPENDECTOMY     AUGMENTATION MAMMAPLASTY  2009   BREAST ENHANCEMENT SURGERY  2009   [place]   CHOLECYSTECTOMY     COLONOSCOPY     Dental tissue graft  08/11/2017   for receding gums   DILATION AND CURETTAGE OF UTERUS     OVARIAN CYST REMOVAL     times 2   TONSILLECTOMY     tummy tuck  [place]   Social History   Social History Narrative   Pt is ambidextrous - prefers L hand for writing, R hand for all other activities   Lives alone in split level home    Has 2 adult daughters   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   Review of Systems As per HPI otherwise negative  Objective:   Physical Exam BP 100/60 (BP Location: Left Arm, Patient Position: Sitting, Cuff Size: Normal)   Pulse (!) 104   Ht 5\' 2"  (1.575 m) Comment: height measured without shoes  Wt 116 lb 2 oz (52.7 kg)   LMP 06/20/2011   BMI 21.24 kg/m  WDWN woman NAD Eyes  anicteric Lungs cta Cor NL S1S2 no rmg Abd thin soft NT no HSM/mass Alert and oriented x 3

## 2020-10-21 NOTE — Patient Instructions (Addendum)
I think trying to come off omeprazole is worth a try.  Take 40 mg omeprazole every other day and see if you can space it out and then eventually stop it. Avoid foods that trigger reflux and also you may need Pepcid 20 mg (over the counter) or antacids like Tums or Gaviscon as needed.  Will follow- up when we see each other for the colonoscopy.  Due to recent changes in healthcare laws, you may see the results of your imaging and laboratory studies on MyChart before your provider has had a chance to review them.  We understand that in some cases there may be results that are confusing or concerning to you. Not all laboratory results come back in the same time frame and the provider may be waiting for multiple results in order to interpret others.  Please give [place] 48 hours in order for your provider to thoroughly review all the results before contacting the office for clarification of your results.    I appreciate the opportunity to care for you. Iva Boop, MD, [facility]

## 2020-11-03 ENCOUNTER — Encounter: Payer: Self-pay | Admitting: Internal Medicine

## 2020-11-03 ENCOUNTER — Telehealth: Payer: Self-pay | Admitting: Internal Medicine

## 2020-11-03 ENCOUNTER — Other Ambulatory Visit: Payer: Self-pay

## 2020-11-03 ENCOUNTER — Ambulatory Visit (AMBULATORY_SURGERY_CENTER): Payer: 59 | Admitting: Internal Medicine

## 2020-11-03 VITALS — BP 111/71 | HR 84 | Temp 99.1°F | Resp 18 | Ht 62.0 in | Wt 116.0 lb

## 2020-11-03 DIAGNOSIS — Z1211 Encounter for screening for malignant neoplasm of colon: Secondary | ICD-10-CM

## 2020-11-03 MED ORDER — SODIUM CHLORIDE 0.9 % IV SOLN: 500.0000 mL | Freq: Once | INTRAVENOUS | Status: DC

## 2020-11-03 NOTE — Progress Notes (Signed)
Pt's states no medical or surgical changes since previsit or office visit.  CW - vitals 

## 2020-11-03 NOTE — Telephone Encounter (Signed)
See note below.  Thank you

## 2020-11-03 NOTE — Telephone Encounter (Signed)
Left VM for pt to call [facility] back.

## 2020-11-03 NOTE — Op Note (Signed)
[facility] Patient Name: Sarah Zhang Procedure Date: 11/03/2020 11:43 AM MRN: [id number] Endoscopist: Iva Boop , MD Age: 60 Referring MD:  Date of Birth: Jan 21, 1961 Gender: Female Account #: [id number] Procedure:                Colonoscopy Indications:              Screening for colorectal malignant neoplasm, Last                            colonoscopy: 2012 Medicines:                Propofol per Anesthesia, Monitored Anesthesia Care Procedure:                Pre-Anesthesia Assessment:                           - Prior to the procedure, a History and Physical                            was performed, and patient medications and                            allergies were reviewed. The patient's tolerance of                            previous anesthesia was also reviewed. The risks                            and benefits of the procedure and the sedation                            options and risks were discussed with the patient.                            All questions were answered, and informed consent                            was obtained. Prior Anticoagulants: The patient has                            taken no previous anticoagulant or antiplatelet                            agents. ASA Grade Assessment: II - A patient with                            mild systemic disease. After reviewing the risks                            and benefits, the patient was deemed in                            satisfactory condition to undergo the procedure.  After obtaining informed consent, the colonoscope                            was passed under direct vision. Throughout the                            procedure, the patient's blood pressure, pulse, and                            oxygen saturations were monitored continuously. The                            PCF-HQ190L Colonoscope was introduced through the                            anus  and advanced to the the cecum, identified by                            appendiceal orifice and ileocecal valve. The                            colonoscopy was performed without difficulty. The                            patient tolerated the procedure well. The quality                            of the bowel preparation was good. The ileocecal                            valve, appendiceal orifice, and rectum were                            photographed. The bowel preparation used was                            Miralax via split dose instruction. Scope In: 11:55:54 AM Scope Out: 12:11:13 PM Scope Withdrawal Time: 0 hours 10 minutes 19 seconds  Total Procedure Duration: 0 hours 15 minutes 19 seconds  Findings:                 The perianal and digital rectal examinations were                            normal.                           Multiple diverticula were found in the sigmoid                            colon.                           The exam was otherwise without abnormality on  direct and retroflexion views. Complications:            No immediate complications. Estimated Blood Loss:     Estimated blood loss: none. Impression:               - Diverticulosis in the sigmoid colon.                           - The examination was otherwise normal on direct                            and retroflexion views.                           - No specimens collected. Recommendation:           - Patient has a contact number available for                            emergencies. The signs and symptoms of potential                            delayed complications were discussed with the                            patient. Return to normal activities tomorrow.                            Written discharge instructions were provided to the                            patient.                           - Resume previous diet.                           - Continue present  medications.                           - Repeat colonoscopy in 10 years for screening                            purposes. Iva Boop, MD 11/03/2020 12:16:51 PM This report has been signed electronically.

## 2020-11-03 NOTE — Progress Notes (Signed)
Report to PACU, RN, vss, BBS= Clear.  

## 2020-11-03 NOTE — Patient Instructions (Addendum)
No polyps or cancer were seen today. You do have diverticulosis - thickened muscle rings and pouches in the colon wall. Please read the handout about this condition.   Next routine colonoscopy or other screening test in 10 years - 2032.  I appreciate the opportunity to care for you. Iva Boop, MD, [facility]   Thank you for letting us take care of your healthcare needs today. Please see handouts given to you on Diverticulosis.  YOU HAD AN ENDOSCOPIC PROCEDURE TODAY AT THE [facility]:   Refer to the procedure report that was given to you for any specific questions about what was found during the examination.  If the procedure report does not answer your questions, please call your gastroenterologist to clarify.  If you requested that your care partner not be given the details of your procedure findings, then the procedure report has been included in a sealed envelope for you to review at your convenience later.  YOU SHOULD EXPECT: Some feelings of bloating in the abdomen. Passage of more gas than usual.  Walking can help get rid of the air that was put into your GI tract during the procedure and reduce the bloating. If you had a lower endoscopy (such as a colonoscopy or flexible sigmoidoscopy) you may notice spotting of blood in your stool or on the toilet paper. If you underwent a bowel prep for your procedure, you may not have a normal bowel movement for a few days.  Please Note:  You might notice some irritation and congestion in your nose or some drainage.  This is from the oxygen used during your procedure.  There is no need for concern and it should clear up in a day or so.  SYMPTOMS TO REPORT IMMEDIATELY:  Following lower endoscopy (colonoscopy or flexible sigmoidoscopy):  Excessive amounts of blood in the stool  Significant tenderness or worsening of abdominal pains  Swelling of the abdomen that is new, acute  Fever of 100F or higher   For urgent or emergent issues,  a gastroenterologist can be reached at any hour by calling [phone]. Do not use MyChart messaging for urgent concerns.    DIET:  We do recommend a small meal at first, but then you may proceed to your regular diet.  Drink plenty of fluids but you should avoid alcoholic beverages for 24 hours.  ACTIVITY:  You should plan to take it easy for the rest of today and you should NOT DRIVE or use heavy machinery until tomorrow (because of the sedation medicines used during the test).    FOLLOW UP: Our staff will call the number listed on your records 48-72 hours following your procedure to check on you and address any questions or concerns that you may have regarding the information given to you following your procedure. If we do not reach you, we will leave a message.  We will attempt to reach you two times.  During this call, we will ask if you have developed any symptoms of COVID 19. If you develop any symptoms (ie: fever, flu-like symptoms, shortness of breath, cough etc.) before then, please call [phone].  If you test positive for Covid 19 in the 2 weeks post procedure, please call and report this information to [place].    If any biopsies were taken you will be contacted by phone or by letter within the next 1-3 weeks.  Please call us at [phone] if you have not heard about the biopsies in 3 weeks.  SIGNATURES/CONFIDENTIALITY: You and/or your care partner have signed paperwork which will be entered into your electronic medical record.  These signatures attest to the fact that that the information above on your After Visit Summary has been reviewed and is understood.  Full responsibility of the confidentiality of this discharge information lies with you and/or your care-partner.

## 2020-11-03 NOTE — Telephone Encounter (Signed)
Pt returned call. Stated that she vomited about 1hr after taking the second prep. Stated that last bowel movement was liquid, and yellow in color. Advised patient that we could still proceed with the procedure, and she had no further concerns.

## 2020-11-03 NOTE — Progress Notes (Signed)
History and Physical Interval Note:  11/03/2020 11:48 AM  Sarah Zhang  has presented today for endoscopic procedure(s), with the diagnosis of  Encounter Diagnosis  Name Primary?   Special screening for malignant neoplasms, colon Yes  .  The various methods of evaluation and treatment have been discussed with the patient and/or family. After consideration of risks, benefits and other options for treatment, the patient has consented to  the endoscopic procedure(s).   The patient's history has been reviewed, patient examined, no change in status, stable for endoscopic procedure(s).  I have reviewed the patient's chart and labs.  Questions were answered to the patient's satisfaction.     Iva Boop, MD, [facility]

## 2020-11-03 NOTE — Telephone Encounter (Signed)
Inbound call from pt requesting a call back stating that she is vomiting. Pt's procedure is today. Please advise. Thank you.

## 2020-11-05 ENCOUNTER — Telehealth: Payer: Self-pay

## 2020-11-05 NOTE — Telephone Encounter (Signed)
  Follow up Call-  Call back number 11/03/2020  Post procedure Call Back phone  # [phone]  Permission to leave phone message Yes  Some recent data might be hidden     Patient questions:  Do you have a fever, pain , or abdominal swelling? No. Pain Score  0 *  Have you tolerated food without any problems? Yes.    Have you been able to return to your normal activities? Yes.    Do you have any questions about your discharge instructions: Diet   No. Medications  No. Follow up visit  No.  Do you have questions or concerns about your Care? No.  Actions: * If pain score is 4 or above: No action needed, pain <4.

## 2021-01-30 ENCOUNTER — Other Ambulatory Visit: Payer: Self-pay

## 2021-01-30 ENCOUNTER — Other Ambulatory Visit: Payer: Self-pay | Admitting: Family Medicine

## 2021-01-30 ENCOUNTER — Ambulatory Visit
Admission: RE | Admit: 2021-01-30 | Discharge: 2021-01-30 | Disposition: A | Discharge status: Home / Self Care | Payer: 59 | Source: Ambulatory Visit | Attending: Family Medicine | Admitting: Family Medicine

## 2021-01-30 DIAGNOSIS — M25532 Pain in left wrist: Secondary | ICD-10-CM

## 2021-01-30 DIAGNOSIS — M25521 Pain in right elbow: Secondary | ICD-10-CM

## 2021-02-04 IMAGING — MG DIGITAL SCREENING BILATERAL MAMMOGRAM WITH IMPLANTS, CAD AND TOM
9 of 12 series · 9 of 28 positions shown · non-contrast
Comparison: Previous exam(s).

CLINICAL DATA: Screening.

EXAM:
DIGITAL SCREENING BILATERAL MAMMOGRAM WITH IMPLANTS, CAD AND TOMO
The patient has retropectoral implants. Standard and implant
displaced views were performed.

[R CC]
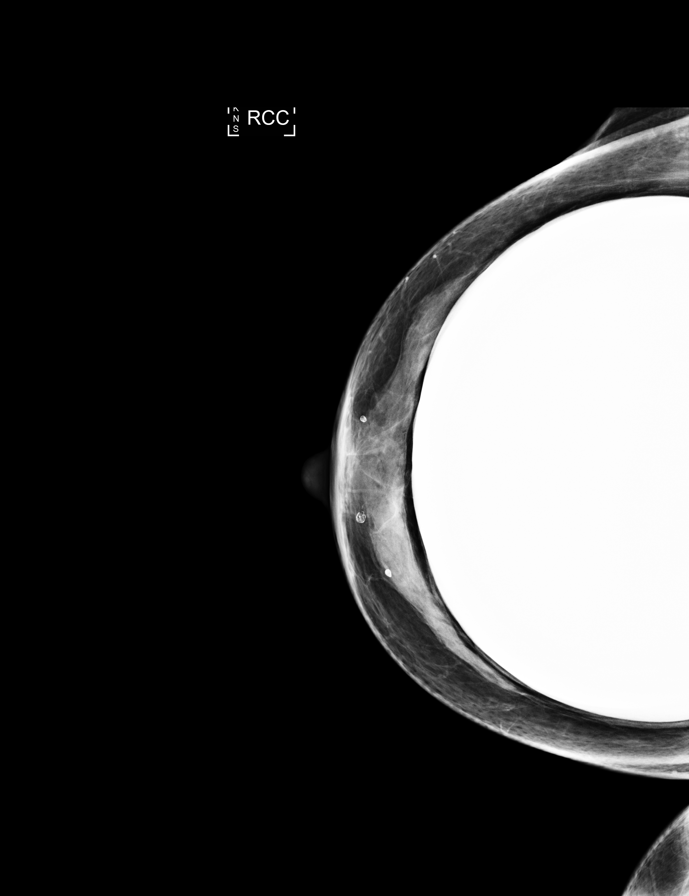

[R MLO]
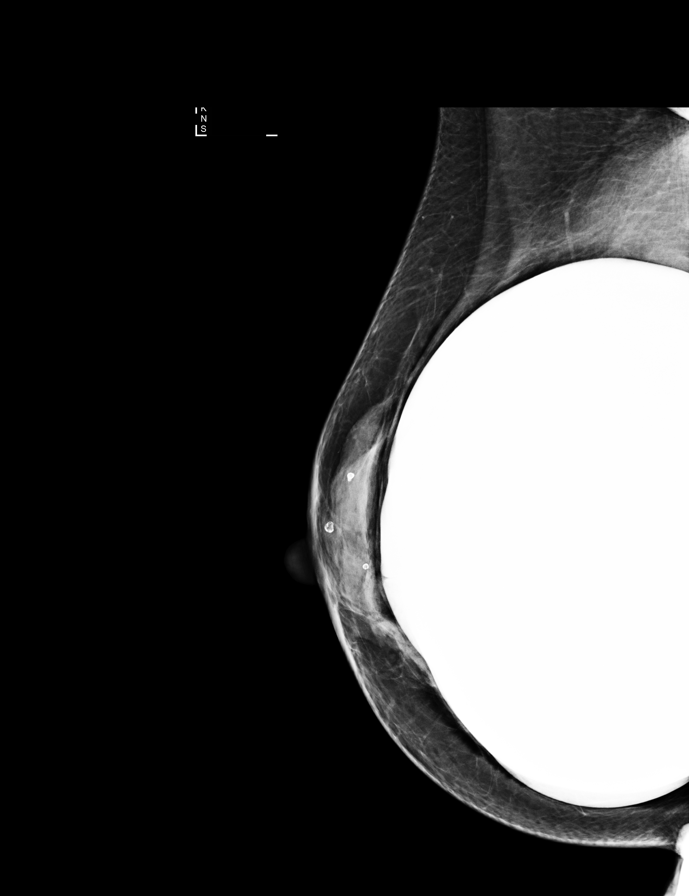

[L CC]
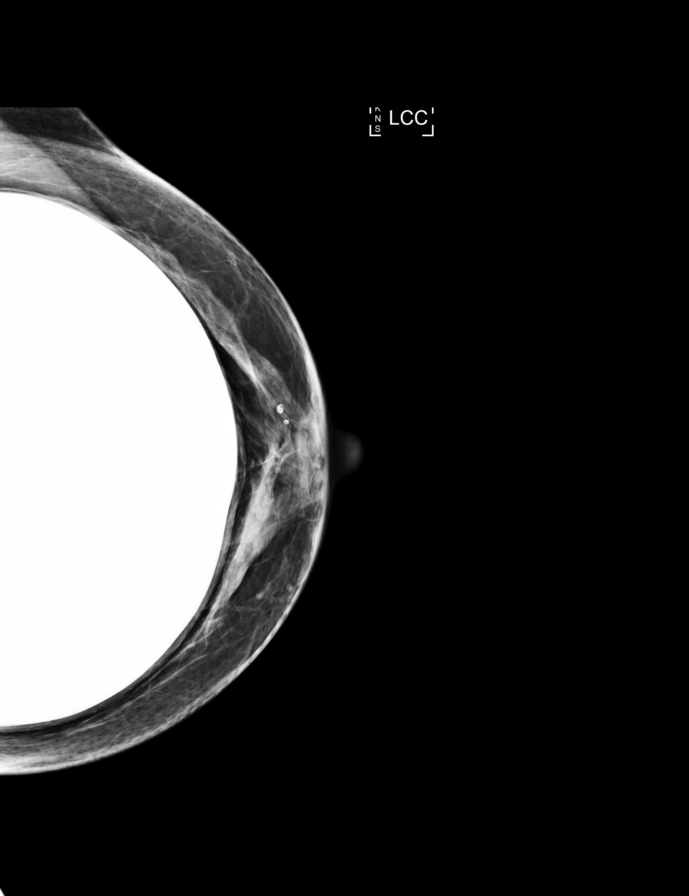

[L MLO]
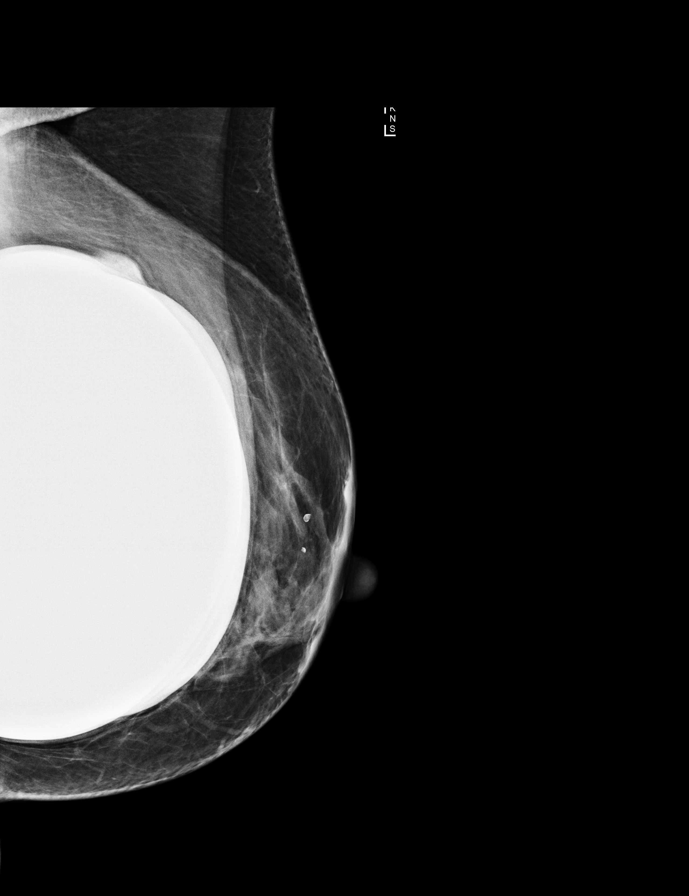

[L CC synth-2D]
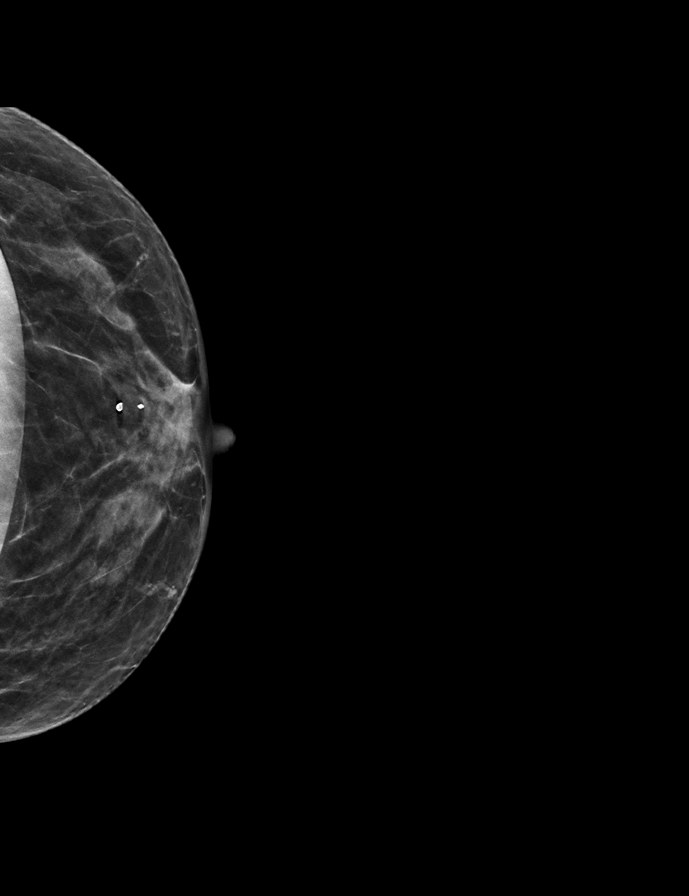

[R MLO synth-2D]
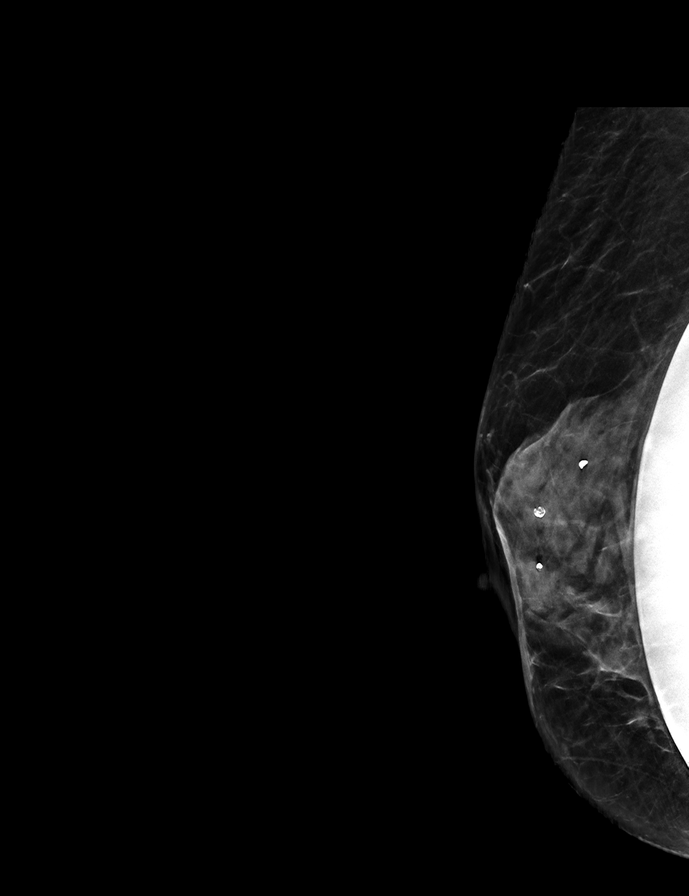

[R CC synth-2D]
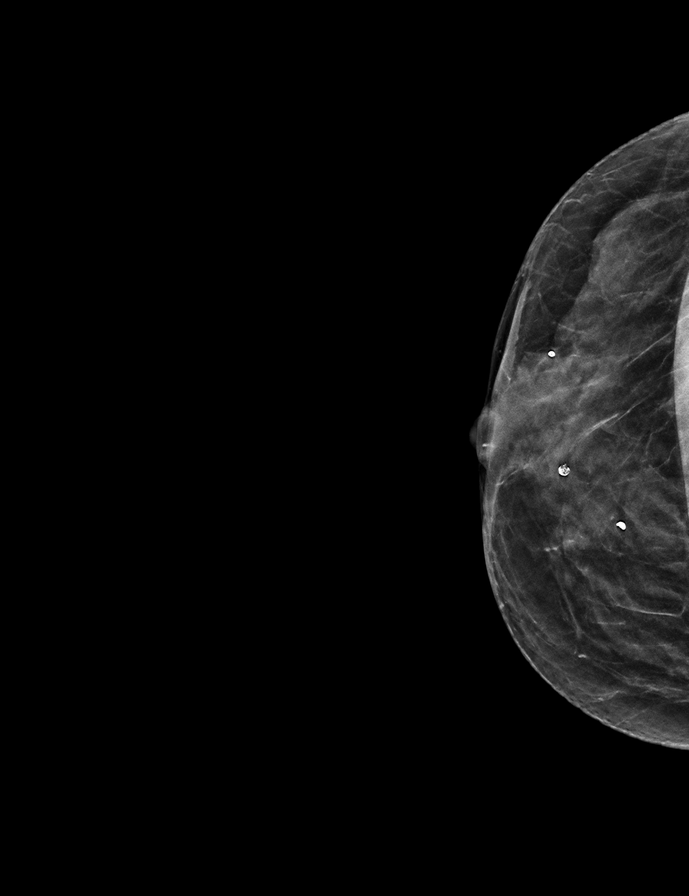

[L MLO synth-2D]
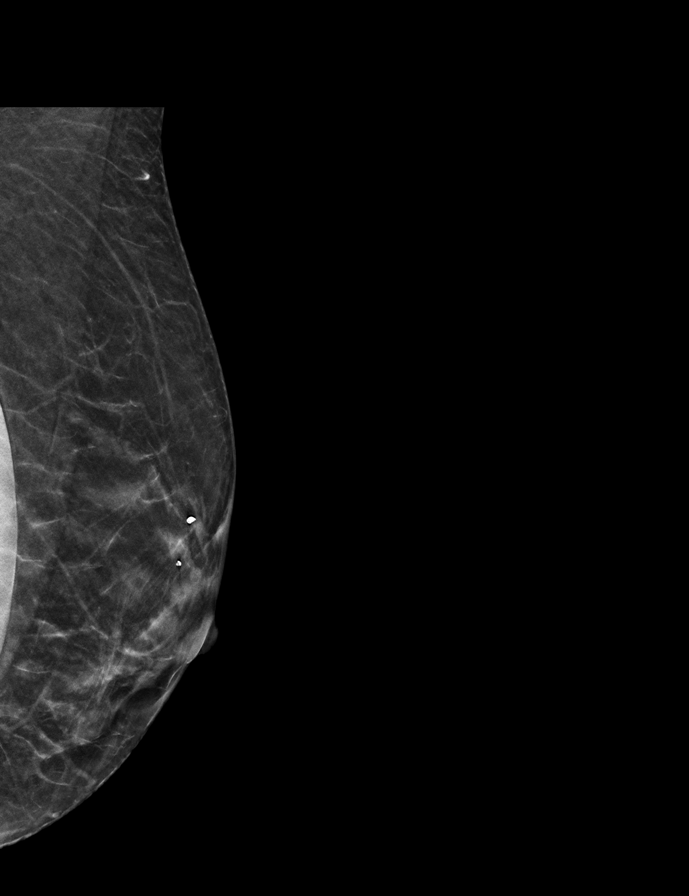

[L MLOID BREAST TOMOSYNTHESIS IMAGE tomo · tomo slice 19/37.0]
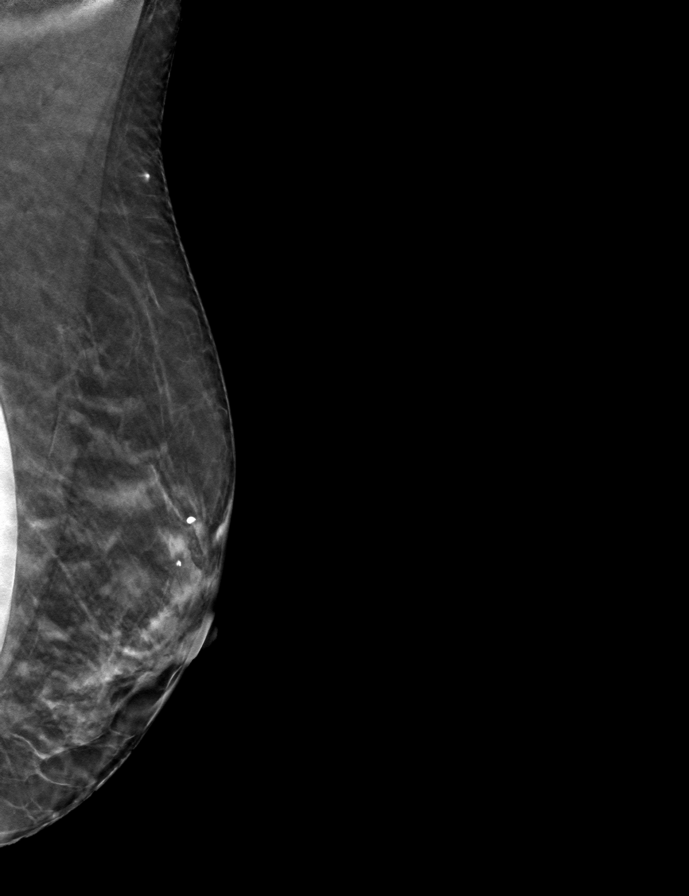

[9 of 28 positions shown; findings below may reference images not displayed]

ACR Breast Density Category c: The breast tissue is heterogeneously
dense, which may obscure small masses.
FINDINGS: There are no findings suspicious for malignancy. Images were
processed with CAD.
IMPRESSION: No mammographic evidence of malignancy. A result letter of this
screening mammogram will be mailed directly to the patient.

RECOMMENDATION:
Screening mammogram in one year. (Code:49-X-OQ9)

BI-RADS CATEGORY  1:  Negative.

## 2021-06-19 ENCOUNTER — Other Ambulatory Visit: Payer: Self-pay | Admitting: Family Medicine

## 2021-06-19 ENCOUNTER — Ambulatory Visit
Admission: RE | Admit: 2021-06-19 | Discharge: 2021-06-19 | Disposition: A | Discharge status: Home / Self Care | Payer: 59 | Source: Ambulatory Visit | Attending: Family Medicine | Admitting: Family Medicine

## 2021-06-19 DIAGNOSIS — R058 Other specified cough: Secondary | ICD-10-CM

## 2021-07-22 ENCOUNTER — Other Ambulatory Visit (HOSPITAL_COMMUNITY)
Admission: RE | Admit: 2021-07-22 | Discharge: 2021-07-22 | Disposition: A | Discharge status: Home / Self Care | Payer: Managed Care, Other (non HMO) | Source: Ambulatory Visit | Attending: Obstetrics & Gynecology | Admitting: Obstetrics & Gynecology

## 2021-07-22 ENCOUNTER — Encounter: Payer: Self-pay | Admitting: Obstetrics & Gynecology

## 2021-07-22 ENCOUNTER — Ambulatory Visit (INDEPENDENT_AMBULATORY_CARE_PROVIDER_SITE_OTHER): Payer: Managed Care, Other (non HMO) | Admitting: Obstetrics & Gynecology

## 2021-07-22 VITALS — BP 104/64 | HR 84 | Ht 62.25 in | Wt 111.0 lb

## 2021-07-22 DIAGNOSIS — R350 Frequency of micturition: Secondary | ICD-10-CM

## 2021-07-22 DIAGNOSIS — Z01419 Encounter for gynecological examination (general) (routine) without abnormal findings: Secondary | ICD-10-CM

## 2021-07-22 DIAGNOSIS — Z780 Asymptomatic menopausal state: Secondary | ICD-10-CM

## 2021-07-22 DIAGNOSIS — K644 Residual hemorrhoidal skin tags: Secondary | ICD-10-CM | POA: Diagnosis not present

## 2021-07-22 DIAGNOSIS — N898 Other specified noninflammatory disorders of vagina: Secondary | ICD-10-CM | POA: Diagnosis not present

## 2021-07-22 MED ORDER — FLUCONAZOLE 150 MG PO TABS: 150.0000 mg | ORAL_TABLET | Freq: Once | ORAL | 2 refills | Status: AC

## 2021-07-22 NOTE — Progress Notes (Addendum)
Sarah Zhang 1960-07-07 [id number]   History:    61 y.o. G3P2A1L2 Divorced.  Teaching at [employer].  2 daughters have children, 2 grand-sons 4 and 22 yo.   RP:  Established patient presenting for annual gyn exam   HPI: Postmenopause, well on no HRT.  No PMB.  No pelvic pain.  C/O mild Vaginal itching.  Urinary frequency.  Currently abstinent.  Pap Neg 06/2020.  Pap reflex today.  Breasts normal.  Mammo Neg in 08/2020.  BMI 20.14.  Exercising regularly.  BD normal in 2019.  Colono Neg in 10/2020. Small skin tag perianally bothers her. Health labs with Fam MD.  Referred to Endocrino to manage Hypothyroidism.    Past medical history,surgical history, family history and social history were all reviewed and documented in the EPIC chart.  Gynecologic History Patient's last menstrual period was 06/20/2011. Obstetric History OB History  Gravida Para Term Preterm AB Living  3 2 2   1 2   SAB IAB Ectopic Multiple Live Births  1       2    # Outcome Date GA Lbr Len/2nd Weight Sex Delivery Anes PTL Lv  3 SAB           2 Term           1 Term              ROS: A ROS was performed and pertinent positives and negatives are included in the history. GENERAL: No fevers or chills. HEENT: No change in vision, no earache, sore throat or sinus congestion. NECK: No pain or stiffness. CARDIOVASCULAR: No chest pain or pressure. No palpitations. PULMONARY: No shortness of breath, cough or wheeze. GASTROINTESTINAL: No abdominal pain, nausea, vomiting or diarrhea, melena or bright red blood per rectum. GENITOURINARY: No urinary frequency, urgency, hesitancy or dysuria. MUSCULOSKELETAL: No joint or muscle pain, no back pain, no recent trauma. DERMATOLOGIC: No rash, no itching, no lesions. ENDOCRINE: No polyuria, polydipsia, no heat or cold intolerance. No recent change in weight. HEMATOLOGICAL: No anemia or easy bruising or bleeding. NEUROLOGIC: No headache, seizures, numbness, tingling or weakness.  PSYCHIATRIC: No depression, no loss of interest in normal activity or change in sleep pattern.     Exam:   BP 104/64   Pulse 84   Ht 5' 2.25" (1.581 m)   Wt 111 lb (50.3 kg)   LMP 06/20/2011   SpO2 99%   BMI 20.14 kg/m   Body mass index is 20.14 kg/m.  General appearance : Well developed well nourished female. No acute distress HEENT: Eyes: no retinal hemorrhage or exudates,  Neck supple, trachea midline, no carotid bruits, no thyroidmegaly Lungs: Clear to auscultation, no rhonchi or wheezes, or rib retractions  Heart: Regular rate and rhythm, no murmurs or gallops Breast:Examined in sitting and supine position were symmetrical in appearance, no palpable masses or tenderness,  no skin retraction, no nipple inversion, no nipple discharge, no skin discoloration, no axillary or supraclavicular lymphadenopathy Abdomen: no palpable masses or tenderness, no rebound or guarding Extremities: no edema or skin discoloration or tenderness  Pelvic: Vulva: Normal             Vagina: No gross lesions or discharge  Cervix: No gross lesions or discharge.  Pap reflex done  Uterus  AV, normal size, shape and consistency, non-tender and mobile  Adnexa  Without masses or tenderness  Anus: Normal, except for a small skin tag left perianal area  U/A: Yellow clear, Nit Neg, Pro  Neg, WBC 0-5, RBC Neg, Bacteria Neg.     Assessment/Plan:  61 y.o. female for annual exam   1. Encounter for routine gynecological examination with Papanicolaou smear of cervix Postmenopause, well on no HRT.  No PMB.  No pelvic pain.  C/O mild Vaginal itching.  Urinary frequency.  Currently abstinent.  Pap Neg 06/2020.  Pap reflex today.  Breasts normal.  Mammo Neg in 08/2020.  BMI 20.14.  Exercising regularly.  BD normal in 2019.  Colono Neg in 10/2020. Small skin tag perianally bothers her. Health labs with Fam MD.  Referred to Endocrino to manage Hypothyroidism. - Cytology - PAP( [place])  2. Postmenopause Well on no  HRT.  No PMB.  BD normal in 2019.  3. Urinary frequency R/O Cystitis.  U/A Negative. - Urinalysis,Complete w/RFL Culture  4. Vagina itching Possible mild yeast vaginitis.  Fluconazole 150 mg 1 tab PO prescribed.  5. Skin tag of anus Would like to f/u for skin tag excision.  Other orders - valACYclovir (VALTREX) 1000 MG tablet; Take 2,000 mg by mouth once. - levothyroxine (SYNTHROID) 75 MCG tablet; 1 tablet in the morning on an empty stomach - fluconazole (DIFLUCAN) 150 MG tablet; Take 1 tablet (150 mg total) by mouth once for 1 dose.   [name] MD, 11:00 AM 07/22/2021

## 2021-07-23 LAB — CYTOLOGY - PAP: Diagnosis: NEGATIVE

## 2021-07-24 LAB — URINALYSIS, COMPLETE W/RFL CULTURE
Bacteria, UA: NONE SEEN /HPF
Bilirubin Urine: NEGATIVE
Casts: NONE SEEN /LPF
Crystals: NONE SEEN /HPF
Glucose, UA: NEGATIVE
Hgb urine dipstick: NEGATIVE
Hyaline Cast: NONE SEEN /LPF
Ketones, ur: NEGATIVE
Nitrites, Initial: NEGATIVE
Protein, ur: NEGATIVE
RBC / HPF: NONE SEEN /HPF (ref 0–2)
Specific Gravity, Urine: 1.015 (ref 1.001–1.035)
Yeast: NONE SEEN /HPF
pH: 5.5 (ref 5.0–8.0)

## 2021-07-24 LAB — URINE CULTURE
MICRO NUMBER:: 13583349
Result:: NO GROWTH
SPECIMEN QUALITY:: ADEQUATE

## 2021-07-24 LAB — CULTURE INDICATED

## 2021-07-30 ENCOUNTER — Other Ambulatory Visit: Payer: Self-pay | Admitting: Family Medicine

## 2021-07-30 DIAGNOSIS — Z1231 Encounter for screening mammogram for malignant neoplasm of breast: Secondary | ICD-10-CM

## 2021-09-03 ENCOUNTER — Ambulatory Visit
Admission: RE | Admit: 2021-09-03 | Discharge: 2021-09-03 | Disposition: A | Discharge status: Home / Self Care | Payer: Managed Care, Other (non HMO) | Source: Ambulatory Visit | Attending: Family Medicine | Admitting: Family Medicine

## 2021-09-03 DIAGNOSIS — Z1231 Encounter for screening mammogram for malignant neoplasm of breast: Secondary | ICD-10-CM

## 2021-09-07 ENCOUNTER — Other Ambulatory Visit: Payer: Self-pay | Admitting: Family Medicine

## 2021-09-07 DIAGNOSIS — R928 Other abnormal and inconclusive findings on diagnostic imaging of breast: Secondary | ICD-10-CM

## 2021-09-14 ENCOUNTER — Other Ambulatory Visit: Payer: Self-pay | Admitting: Obstetrics & Gynecology

## 2021-09-14 DIAGNOSIS — R928 Other abnormal and inconclusive findings on diagnostic imaging of breast: Secondary | ICD-10-CM

## 2021-09-15 ENCOUNTER — Other Ambulatory Visit: Payer: Managed Care, Other (non HMO)

## 2021-09-15 ENCOUNTER — Other Ambulatory Visit: Payer: Self-pay | Admitting: Radiology

## 2021-09-15 ENCOUNTER — Ambulatory Visit
Admission: RE | Admit: 2021-09-15 | Discharge: 2021-09-15 | Disposition: A | Discharge status: Home / Self Care | Payer: Managed Care, Other (non HMO) | Source: Ambulatory Visit | Attending: Obstetrics & Gynecology | Admitting: Obstetrics & Gynecology

## 2021-09-15 ENCOUNTER — Ambulatory Visit: Admission: RE | Admit: 2021-09-15 | Payer: Managed Care, Other (non HMO) | Source: Ambulatory Visit

## 2021-09-15 DIAGNOSIS — R928 Other abnormal and inconclusive findings on diagnostic imaging of breast: Secondary | ICD-10-CM

## 2022-01-29 ENCOUNTER — Telehealth: Payer: Self-pay

## 2022-01-29 ENCOUNTER — Other Ambulatory Visit: Payer: Self-pay

## 2022-01-29 DIAGNOSIS — K644 Residual hemorrhoidal skin tags: Secondary | ICD-10-CM

## 2022-01-29 NOTE — Telephone Encounter (Signed)
Pt scheduled on 03/19/2022.

## 2022-01-29 NOTE — Telephone Encounter (Signed)
FYI. Pt calling to report skin tag mentioned at annual exam in 06/2021 has increased in size and is really bothersome. Desires for it to be removed. Pt had questions as far as care afterwards and preventing infection since it is so close to rectum.  Pt advised that ML will inform her of care at appt for removal. Will send a msg to schedulers to schedule.

## 2022-03-19 ENCOUNTER — Ambulatory Visit: Payer: Managed Care, Other (non HMO) | Admitting: Obstetrics & Gynecology

## 2022-03-19 ENCOUNTER — Other Ambulatory Visit (HOSPITAL_COMMUNITY)
Admission: RE | Admit: 2022-03-19 | Discharge: 2022-03-19 | Disposition: A | Discharge status: Home / Self Care | Payer: Managed Care, Other (non HMO) | Source: Ambulatory Visit | Attending: Obstetrics & Gynecology | Admitting: Obstetrics & Gynecology

## 2022-03-19 ENCOUNTER — Encounter: Payer: Self-pay | Admitting: Obstetrics & Gynecology

## 2022-03-19 VITALS — BP 110/74 | HR 68 | Resp 16

## 2022-03-19 DIAGNOSIS — K644 Residual hemorrhoidal skin tags: Secondary | ICD-10-CM

## 2022-03-19 DIAGNOSIS — K6282 Dysplasia of anus: Secondary | ICD-10-CM

## 2022-03-19 HISTORY — DX: Dysplasia of anus: K62.82

## 2022-03-19 NOTE — Progress Notes (Signed)
    Sarah Zhang 17-Jun-1960 [id number]        61 y.o.  G3P2A1L2   RP: Left peri-anal skin tag removal   HPI: Uncomfortable left peri-anal skin tag, confirmed on Gyn exam 07/22/21.  No bleeding.   OB History  Gravida Para Term Preterm AB Living  3 2 2   1 2  $ SAB IAB Ectopic Multiple Live Births  1       2    # Outcome Date GA Lbr Len/2nd Weight Sex Delivery Anes PTL Lv  3 SAB           2 Term           1 Term             Past medical history,surgical history, problem list, medications, allergies, family history and social history were all reviewed and documented in the EPIC chart.   Directed ROS with pertinent positives and negatives documented in the history of present illness/assessment and plan.  Exam:  Vitals:   03/19/22 1433  BP: 110/74  Pulse: 68  Resp: 16   General appearance:  Normal  Written informed consent obtained.  Betadine prep.  Local anesthesia with Lidocaine 1%.  Skin tag grasped with a forceps and cut with the scissors at the base.  Closed with 1 separate stitch of Vicryl 4-0.  Good hemostasis.  External non-thrombosed hemorrhoids present.  No bleeding.   Assessment/Plan:  62 y.o. [id number]   1. Skin tag of anus  Uncomfortable left peri-anal skin tag, confirmed on Gyn exam 07/22/21.  No bleeding. Easy removal of skin tag.  No Cx.  Pending pathology.  Post procedure precautions.  Patient aware that has external non-thrombosed hemorrhoids.  If hemorrhoids become symptomatic, will proceed with removal with general surgeon. - Surgical pathology ([facility]/ [facility])   Princess Bruins MD, 3:04 PM 03/19/2022

## 2022-03-23 LAB — SURGICAL PATHOLOGY

## 2022-03-25 ENCOUNTER — Telehealth: Payer: Self-pay

## 2022-03-25 DIAGNOSIS — A630 Anogenital (venereal) warts: Secondary | ICD-10-CM

## 2022-03-25 NOTE — Telephone Encounter (Signed)
External referral order placed.  Staff message sent to Catalina Lunger at [facility] to arrange appt for patient.

## 2022-03-25 NOTE — Telephone Encounter (Signed)
Princess Bruins, MD  [facility] Results AIN 1, anal condyloma.  No other lesion present.  Recommend Anal Colposcopy with Rectal surgeon.  FINAL MICROSCOPIC DIAGNOSIS:  A. SKIN TAG, PERIANAL, EXCISION: - Low-grade squamous intraepithelial lesion (AIN1, anal condyloma)

## 2022-03-25 NOTE — Telephone Encounter (Signed)
I spoke with Dr. Marguerita Merles because the colo-rectal surgeons at [facility] at happy to see patient for anal condyloma but do not perform colposcopy.  I spoke with [facility] as well. Their colo-rectal surgeons do not have colposcope.  Dr. Jerilynn Mages said to go ahead and refer. The surgeons office did say they will get her scheduled for what patient needs.

## 2022-03-29 NOTE — Telephone Encounter (Signed)
Sarah Zhang, RMA Pt has an appt with Dr Dema Severin at [facility] on 04/27/22 at 9:10 arrive at 8:40. Pt aware

## 2022-04-22 ENCOUNTER — Telehealth: Payer: Self-pay

## 2022-04-22 NOTE — Telephone Encounter (Signed)
Patient cannot see her pathology result on My Chart. She asked if she could come by and pick up copy. Copy at front desk for pick up.

## 2022-05-20 ENCOUNTER — Telehealth: Payer: Self-pay | Admitting: Internal Medicine

## 2022-05-20 NOTE — Telephone Encounter (Signed)
Inbound call from patient is requesting to speak to a nurse in regards to omeprazole medication. Patient is asking when she should stop taking medications.

## 2022-05-20 NOTE — Telephone Encounter (Signed)
I called and left her a detailed message to call back and set up an appointment with either an APP or Dr Leone Payor as she has not been seen since October 2022. That way she can discuss with them the best plan of action for her GERD.

## 2022-05-27 NOTE — Telephone Encounter (Signed)
I have left Sarah Zhang another detailed message to call us back and set up an appointment.

## 2022-06-08 NOTE — Telephone Encounter (Signed)
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 She said she is taking her omeprazole every 2 days.

## 2022-08-03 ENCOUNTER — Encounter: Payer: Self-pay | Admitting: Obstetrics & Gynecology

## 2022-08-03 ENCOUNTER — Ambulatory Visit (INDEPENDENT_AMBULATORY_CARE_PROVIDER_SITE_OTHER): Payer: Managed Care, Other (non HMO) | Admitting: Obstetrics & Gynecology

## 2022-08-03 ENCOUNTER — Other Ambulatory Visit (HOSPITAL_COMMUNITY)
Admission: RE | Admit: 2022-08-03 | Discharge: 2022-08-03 | Disposition: A | Discharge status: Home / Self Care | Payer: Managed Care, Other (non HMO) | Source: Ambulatory Visit | Attending: Obstetrics & Gynecology | Admitting: Obstetrics & Gynecology

## 2022-08-03 VITALS — BP 126/84 | HR 89 | Ht 62.5 in | Wt 115.0 lb

## 2022-08-03 DIAGNOSIS — Z780 Asymptomatic menopausal state: Secondary | ICD-10-CM | POA: Diagnosis not present

## 2022-08-03 DIAGNOSIS — Z01419 Encounter for gynecological examination (general) (routine) without abnormal findings: Secondary | ICD-10-CM

## 2022-08-03 DIAGNOSIS — A630 Anogenital (venereal) warts: Secondary | ICD-10-CM

## 2022-08-03 NOTE — Progress Notes (Signed)
Lindsea Olivar 20-Jul-1960 [id number]   History:    62 y.o. 62 y.o. 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   RP:  Established patient presenting for annual gyn exam   HPI: Postmenopause, well on no HRT.  No PMB.  No pelvic pain.  Currently abstinent.  Pap Neg 06/2021.  Pap reflex today.  Breasts normal.  Mammo Neg Rt on 09/03/2021 and Lt Dx mammo Neg on 09/15/2021.  BMI 20.7.  Exercising regularly.  BD normal in 2019.  Colono Neg in 10/2020. AIN1 perianally in 02/2022. Health labs with Fam MD.  Endocrino to manage Hypothyroidism.   Past medical history,surgical history, family history and social history were all reviewed and documented in the EPIC chart.  Gynecologic History Patient's last menstrual period was 06/20/2011.  Obstetric History OB History  Gravida Para Term Preterm AB Living  3 2 2   1 2   SAB IAB Ectopic Multiple Live Births  1       2    # Outcome Date GA Lbr Len/2nd Weight Sex Delivery Anes PTL Lv  3 SAB           2 Term           1 Term              ROS: A ROS was performed and pertinent positives and negatives are included in the history. GENERAL: No fevers or chills. HEENT: No change in vision, no earache, sore throat or sinus congestion. NECK: No pain or stiffness. CARDIOVASCULAR: No chest pain or pressure. No palpitations. PULMONARY: No shortness of breath, cough or wheeze. GASTROINTESTINAL: No abdominal pain, nausea, vomiting or diarrhea, melena or bright red blood per rectum. GENITOURINARY: No urinary frequency, urgency, hesitancy or dysuria. MUSCULOSKELETAL: No joint or muscle pain, no back pain, no recent trauma. DERMATOLOGIC: No rash, no itching, no lesions. ENDOCRINE: No polyuria, polydipsia, no heat or cold intolerance. No recent change in weight. HEMATOLOGICAL: No anemia or easy bruising or bleeding. NEUROLOGIC: No headache, seizures, numbness, tingling or weakness. PSYCHIATRIC: No depression, no  loss of interest in normal activity or change in sleep pattern.     Exam:   BP 126/84 (BP Location: Right Arm, Patient Position: Sitting, Cuff Size: Normal)   Pulse 89   Ht 5' 2.5" (1.588 m)   Wt 115 lb (52.2 kg)   LMP 06/20/2011 Comment: not sexually active  SpO2 97%   BMI 20.70 kg/m   Body mass index is 20.7 kg/m.  General appearance : Well developed well nourished female. No acute distress HEENT: Eyes: no retinal hemorrhage or exudates,  Neck supple, trachea midline, no carotid bruits, no thyroidmegaly Lungs: Clear to auscultation, no rhonchi or wheezes, or rib retractions  Heart: Regular rate and rhythm, no murmurs or gallops Breast:Examined in sitting and supine position were symmetrical in appearance, no palpable masses or tenderness,  no skin retraction, no nipple inversion, no nipple discharge, no skin discoloration, no axillary or supraclavicular lymphadenopathy Abdomen: no palpable masses or tenderness, no rebound or guarding Extremities: no edema or skin discoloration or tenderness  Pelvic: Vulva: Normal             Vagina: No gross lesions or discharge  Cervix: No gross lesions or discharge.  Pap reflex done.  Uterus  AV, normal size, shape and consistency, non-tender and mobile  Adnexa  Without masses or tenderness  Anus: Normal   Assessment/Plan:  62 y.o. female  for annual exam   1. Encounter for routine gynecological examination with Papanicolaou smear of cervix Postmenopause, well on no HRT.  No PMB.  No pelvic pain.  Currently abstinent.  Pap Neg 06/2021.  Pap reflex today.  Breasts normal. Mammo Neg Rt on 09/03/2021 and Lt Dx mammo Neg on 09/15/2021.  BMI 20.7.  Exercising regularly.  BD normal in 2019.  Colono Neg in 10/2020. AIN1 perianally in 02/2022. Health labs with Fam MD.  Endocrino to manage Hypothyroidism. - Cytology - PAP( )  2. Postmenopause Postmenopause, well on no HRT.  No PMB.  No pelvic pain.  Currently abstinent.   3. Anal condyloma   AIN1 perianally in 02/2022. Perianal exam normal today.  [name] MD, 10:12 AM

## 2022-08-05 LAB — CYTOLOGY - PAP
Comment: NEGATIVE
Diagnosis: NEGATIVE
High risk HPV: NEGATIVE

## 2022-08-13 ENCOUNTER — Other Ambulatory Visit: Payer: Self-pay | Admitting: Family Medicine

## 2022-08-13 DIAGNOSIS — Z1231 Encounter for screening mammogram for malignant neoplasm of breast: Secondary | ICD-10-CM

## 2022-09-22 ENCOUNTER — Ambulatory Visit
Admission: RE | Admit: 2022-09-22 | Discharge: 2022-09-22 | Disposition: A | Discharge status: Home / Self Care | Payer: Managed Care, Other (non HMO) | Source: Ambulatory Visit | Attending: Family Medicine | Admitting: Family Medicine

## 2022-09-22 DIAGNOSIS — Z1231 Encounter for screening mammogram for malignant neoplasm of breast: Secondary | ICD-10-CM

## 2022-10-05 ENCOUNTER — Telehealth: Payer: Self-pay | Admitting: Internal Medicine

## 2022-10-05 NOTE — Telephone Encounter (Signed)
PT stopped omeprazole and is now having severe heartburn and having a hard time sleeping. She is afraid to take the omeprazole after reading its side effects. She is concerned it will damage her health. Please advise.

## 2022-10-06 NOTE — Telephone Encounter (Signed)
Pt stated that she stopped taking the omeprazole 5 days ago and now having severe heartburn now and unable to sleep. Pt stated that she read the side effects of the medications and concerned since she has been on the medication for so long.  Pt chart reviewed.  Pt requesting if there is something else that she could be prescribed. Pt was notified that Dr. Leone Payor would be out of the office for the rest of the week. Please review and advise.

## 2022-10-10 NOTE — Telephone Encounter (Signed)
I think she should resume omeprazole until she sees me 9/20  Alternative is OTC Pepcid 20 mg bid

## 2022-10-11 NOTE — Telephone Encounter (Signed)
Patient returned your call.

## 2022-10-11 NOTE — Telephone Encounter (Signed)
Left message for pt to call back  °

## 2022-10-12 NOTE — Telephone Encounter (Signed)
Pt made aware Dr. Leone Payor recommendations: Pt verbalized understanding with all questions answered.

## 2022-10-12 NOTE — Telephone Encounter (Signed)
Left message for pt to call back  °

## 2022-10-15 ENCOUNTER — Encounter: Payer: Self-pay | Admitting: Internal Medicine

## 2022-10-15 ENCOUNTER — Ambulatory Visit: Payer: Managed Care, Other (non HMO) | Admitting: Internal Medicine

## 2022-10-15 VITALS — BP 130/62 | HR 77 | Ht 62.0 in | Wt 115.0 lb

## 2022-10-15 DIAGNOSIS — K219 Gastro-esophageal reflux disease without esophagitis: Secondary | ICD-10-CM | POA: Diagnosis not present

## 2022-10-15 MED ORDER — FAMOTIDINE 20 MG PO TABS: 20.0000 mg | ORAL_TABLET | Freq: Every day | ORAL | Status: DC

## 2022-10-15 NOTE — Progress Notes (Signed)
Sarah Zhang 62 y.o. 06-28-1960 [id number]  Assessment & Plan:   Encounter Diagnosis  Name Primary?   Gastroesophageal reflux disease without esophagitis Yes   Reassured about the poor science behind these weak associations with dementia, kidney failure and bone fractures regarding PPI use.  That said it is reasonable to try an H2 blocker.  I will have her try this every evening and if 20 mg is ineffective she can try 40 mg and follow-up as needed.  She could need to go back on PPI.  I do not have an idea to resolve her borborygmi concerns.  I do not think it is a health hazard. Meds ordered this encounter  Medications   famotidine (PEPCID) 20 MG tablet    Sig: Take 1 tablet (20 mg total) by mouth daily. Every evening      Subjective:   Chief Complaint: Heartburn  HPI 62 year old Hispanic woman with a history of GERD, brought photos of EGD from 2014 [facility]) that looked normal.  She had been on PPI for many years but recently became afraid of the reported side effects and stopped it this summer and is now having daily heartburn (actually nocturnal).  Also complains of borborygmi at times.  She was not having any heartburn on her omeprazole.  We had a conversation in 2022 about this and I advised her on how to taper and she tried that this summer and then eventually stopped as above but has had persistent symptoms.  A little bit of coffee 1 or 2 cups a day is following a GERD diet otherwise.  She had lost some weight with thyroid issues but that is rebounding.  There is no dysphagia.  She has been taking an H2 blocker intermittently I suspect it is famotidine and that seems to help.  She bought it OTC. Wt Readings from Last 3 Encounters:  10/15/22 115 lb (52.2 kg)  08/03/22 115 lb (52.2 kg)  07/22/21 111 lb (50.3 kg)    Allergies  Allergen Reactions   Codeine    Sulfonamide Derivatives    Current Meds  Medication Sig   B Complex Vitamins (VITAMIN-B COMPLEX PO)  Take 1 capsule by mouth daily.   CALCIUM PO Take by mouth.     Cholecalciferol (VITAMIN D PO) Take by mouth.         fish oil-omega-3 fatty acids 1000 MG capsule Take 1 g by mouth daily.     levothyroxine (SYNTHROID) 75 MCG tablet Take 1 pill only 6 days a week   LORazepam (ATIVAN) 2 MG tablet Take 1 tablet (2 mg total) by mouth at bedtime as needed for anxiety.   Multiple Vitamin (MULTIVITAMIN) capsule Take 1 capsule by mouth daily.     valACYclovir (VALTREX) 1000 MG tablet Take 2,000 mg by mouth once.   vitamin E 400 UNIT capsule Take 400 Units by mouth daily.     Past Medical History:  Diagnosis Date   Anxiety    Diverticulosis    GERD (gastroesophageal reflux disease)    Hiatal hernia    HSV-1 infection    Hypothyroidism    Insomnia    Osteoarthritis    Scoliosis    Past Surgical History:  Procedure Laterality Date   APPENDECTOMY     AUGMENTATION MAMMAPLASTY  2009   BREAST [place]   CHOLECYSTECTOMY     COLONOSCOPY     Dental tissue graft  08/11/2017   for receding gums  DILATION AND CURETTAGE OF UTERUS     OVARIAN CYST REMOVAL     times 2   TONSILLECTOMY     tummy tuck  2009   [place]   Social History   Social History Narrative   Pt is ambidextrous - prefers L hand for writing, R hand for all other activities   Lives alone in split level home    Has 2 adult daughters   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   Review of Systems As per HPI  Objective:   Physical Exam BP 130/62   Pulse 77   Ht 5\' 2"  (1.575 m)   Wt 115 lb (52.2 kg)   LMP 06/20/2011 Comment: not sexually active  BMI 21.03 kg/m

## 2022-10-15 NOTE — Patient Instructions (Addendum)
_______________________________________________________  If your blood pressure at your visit was 140/90 or greater, please contact your primary care physician to follow up on this.  _______________________________________________________  If you are age 62 or older, your body mass index should be between 23-30. Your Body mass index is 21.03 kg/m. If this is out of the aforementioned range listed, please consider follow up with your Primary Care Provider.  If you are age 59 or younger, your body mass index should be between 19-25. Your Body mass index is 21.03 kg/m. If this is out of the aformentioned range listed, please consider follow up with your Primary Care Provider.   ________________________________________________________  The [facility] providers would like to encourage you to use [facility] to communicate with providers for non-urgent requests or questions.  Due to long hold times on the telephone, sending your provider a message by [facility] may be a faster and more efficient way to get a response.  Please allow 48 business hours for a response.  Please remember that this is for non-urgent requests.  _______________________________________________________  Start Pepcid 20mg  at bedtime. If no improvement increase to 40 mg at bedtime.   It was a pleasure to see you today!  Thank you for trusting me with your gastrointestinal care!

## 2023-01-20 ENCOUNTER — Ambulatory Visit: Payer: Managed Care, Other (non HMO) | Admitting: Internal Medicine

## 2023-02-25 ENCOUNTER — Encounter: Payer: Self-pay | Admitting: Obstetrics and Gynecology

## 2023-04-19 ENCOUNTER — Ambulatory Visit (INDEPENDENT_AMBULATORY_CARE_PROVIDER_SITE_OTHER): Admitting: Student

## 2023-04-19 ENCOUNTER — Ambulatory Visit (INDEPENDENT_AMBULATORY_CARE_PROVIDER_SITE_OTHER)

## 2023-04-19 ENCOUNTER — Encounter (HOSPITAL_BASED_OUTPATIENT_CLINIC_OR_DEPARTMENT_OTHER): Payer: Self-pay | Admitting: Student

## 2023-04-19 DIAGNOSIS — M84378A Stress fracture, left toe(s), initial encounter for fracture: Secondary | ICD-10-CM | POA: Diagnosis not present

## 2023-04-19 DIAGNOSIS — M79675 Pain in left toe(s): Secondary | ICD-10-CM

## 2023-04-19 DIAGNOSIS — M19072 Primary osteoarthritis, left ankle and foot: Secondary | ICD-10-CM | POA: Diagnosis not present

## 2023-04-19 DIAGNOSIS — M25849 Other specified joint disorders, unspecified hand: Secondary | ICD-10-CM | POA: Diagnosis not present

## 2023-04-19 NOTE — Progress Notes (Signed)
 Chief Complaint: Left toe injury     History of Present Illness:    Sarah Zhang is a 63 y.o. female presenting today for evaluation of an injury to her left second toe.  She reports that about 3 weeks ago she had a large yeti thermos fall and land directly on this toe.  This was immediately painful however the toe did not swell or bruise significantly at that time.  This quickly improved however about 3 days ago her pain began to return, which is most noticeable during the night.  She is tolerating walking and weightbearing well.  Has tried ice, rest, and elevation.   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   Anxiety    Diverticulosis    GERD (gastroesophageal reflux disease)    Hiatal hernia    HSV-1 infection    Hypothyroidism    Insomnia    Osteoarthritis    Scoliosis    Past Surgical History:  Procedure Laterality Date   APPENDECTOMY     AUGMENTATION MAMMAPLASTY  2009   BREAST ENHANCEMENT SURGERY  [place]   CHOLECYSTECTOMY     COLONOSCOPY     Dental tissue graft  08/11/2017   for receding gums   DILATION AND CURETTAGE OF UTERUS     OVARIAN CYST REMOVAL     times 2   TONSILLECTOMY     tummy tuck  [place]   [place]   Social History   Socioeconomic History   Marital status: Divorced    Spouse name: Not on file   Number of children: 2   Years of education: Not on file   Highest education level: Not on file  Occupational History   Occupation: Electrical engineer  Tobacco Use   Smoking status: Former    Current packs/day: 0.00    Types: Cigarettes    Quit date: 01/22/1987    Years since quitting: 36.2   Smokeless tobacco: Never  Vaping Use   Vaping status: Never Used  Substance and Sexual Activity   Alcohol use: Yes    Comment: WEEKENDS   Drug use: No   Sexual activity: Not Currently     Birth control/protection: Post-menopausal, Abstinence    Comment: 1st intercourse- 20, partners- less than 5  Other Topics Concern   Not on file  Social History Narrative   Pt is ambidextrous - prefers L hand for writing, R hand for all other activities   Lives alone in split level home    Has 2 adult daughters   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   Social Drivers of Health   Financial Resource Strain: Not on file  Food Insecurity: Not on file  Transportation Needs: Not on file  Physical Activity: Not on file  Stress: Not on file  Social Connections: Not on file   Family History  Problem Relation Age of Onset   Stomach cancer Mother    Hypertension Father    Cirrhosis Father    Alcoholism Sister    Schizophrenia Brother    Stomach cancer Paternal Uncle    Breast cancer Paternal Grandmother    Colon cancer Neg Hx    Rectal cancer Neg Hx  Esophageal cancer Neg Hx    Allergies  Allergen Reactions   Codeine    Sulfonamide Derivatives    Current Outpatient Medications  Medication Sig Dispense Refill   B Complex Vitamins (VITAMIN-B COMPLEX PO) Take 1 capsule by mouth daily.     CALCIUM PO Take by mouth.       Cholecalciferol (VITAMIN D PO) Take by mouth.       famotidine (PEPCID) 20 MG tablet Take 1 tablet (20 mg total) by mouth daily. Every evening     fish oil-omega-3 fatty acids 1000 MG capsule Take 1 g by mouth daily.       levothyroxine (SYNTHROID) 75 MCG tablet Take 1 pill only 6 days a week     LORazepam (ATIVAN) 2 MG tablet Take 1 tablet (2 mg total) by mouth at bedtime as needed for anxiety. 30 tablet 1   Multiple Vitamin (MULTIVITAMIN) capsule Take 1 capsule by mouth daily.       valACYclovir (VALTREX) 1000 MG tablet Take 2,000 mg by mouth once.     vitamin E 400 UNIT capsule Take 400 Units by mouth daily.       No current facility-administered medications for this visit.   No results found.  Review of Systems:   A ROS was performed  including pertinent positives and negatives as documented in the HPI.  Physical Exam :   Constitutional: NAD and appears stated age Neurological: Alert and oriented Psych: Appropriate affect and cooperative Last menstrual period 06/20/2011.   Comprehensive Musculoskeletal Exam:    Exam of the left foot demonstrates no obvious deformity of the toes.  Tenderness to palpation in the second toe at the PIP joint extending proximally throughout the proximal phalanx.  Full range of motion with active flexion and extension.  No significant swelling or ecchymosis.  Dorsalis pedis 2+.  Distal neurosensory exam intact.  Imaging:   Xray (left second toe 3 views): Periosteal reaction noted along the diaphysis of the second proximal phalanx.  Diffuse interphalangeal osteoarthritis.  No other acute abnormality is seen.   I personally reviewed and interpreted the radiographs.   Assessment:   63 y.o. female with an injury to her left second toe that occurred due to a direct trauma mechanism 3 weeks ago.  She is tolerating weightbearing in normal shoes however pain has begun to return over the last few days particularly at night.  X-rays taken today do appear to show a mild periosteal reaction of the proximal phalanx which I believe is likely a demonstration of a stress fracture.  On exam this does correlate with her area of tenderness.  For treatment I have recommended a stiff insole such as carbon fiber to help provide support in order to encourage healing.  She can continue to ice and use Tylenol or ibuprofen as needed.  Discussed that if symptoms worsen or do not improve over the next 3 to 4 weeks, would like to see her back for reevaluation.  Plan :    -Return to clinic as needed     I personally saw and evaluated the patient, and participated in the management and treatment plan.  Hazle Nordmann, PA-C Orthopedics

## 2023-05-30 DIAGNOSIS — J029 Acute pharyngitis, unspecified: Secondary | ICD-10-CM | POA: Diagnosis not present

## 2023-05-30 DIAGNOSIS — J301 Allergic rhinitis due to pollen: Secondary | ICD-10-CM | POA: Diagnosis not present

## 2023-05-30 DIAGNOSIS — Z6820 Body mass index (BMI) 20.0-20.9, adult: Secondary | ICD-10-CM | POA: Diagnosis not present

## 2023-06-19 DIAGNOSIS — E039 Hypothyroidism, unspecified: Secondary | ICD-10-CM | POA: Insufficient documentation

## 2023-06-19 NOTE — Progress Notes (Unsigned)
 New Patient Office Visit  Subjective    Patient ID: Sarah Zhang, female    DOB: 09/30/1960  Age: 63 y.o. MRN: [id number]  CC: No chief complaint on file.   HPI Sarah Zhang presents to establish care today. Up to date on routine vaccines. Up to date on routine screenings.  Receives regular dental and eye care.  Reports eating well, sleeping well, feeling well overall.  Reports compliance with medication regimen.  Denies other concerns today.  Outpatient Encounter Medications as of 06/21/2023  Medication Sig  . B Complex Vitamins (VITAMIN-B COMPLEX PO) Take 1 capsule by mouth daily.  Aaron Aas CALCIUM PO Take by mouth.    . Cholecalciferol (VITAMIN D  PO) Take by mouth.    . famotidine  (PEPCID ) 20 MG tablet Take 1 tablet (20 mg total) by mouth daily. Every evening  . fish oil-omega-3 fatty acids 1000 MG capsule Take 1 g by mouth daily.    Aaron Aas levothyroxine (SYNTHROID) 75 MCG tablet Take 1 pill only 6 days a week  . LORazepam  (ATIVAN ) 2 MG tablet Take 1 tablet (2 mg total) by mouth at bedtime as needed for anxiety.  . Multiple Vitamin (MULTIVITAMIN) capsule Take 1 capsule by mouth daily.    . valACYclovir (VALTREX) 1000 MG tablet Take 2,000 mg by mouth once.  . vitamin E 400 UNIT capsule Take 400 Units by mouth daily.     No facility-administered encounter medications on file as of 06/21/2023.    Past Medical History:  Diagnosis Date  . 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 Anxiety   . Diverticulosis   . GERD (gastroesophageal reflux disease)   . Hiatal hernia   . HSV-1 infection   . Hypothyroidism   . Insomnia   . Osteoarthritis   . Scoliosis     Past Surgical History:  Procedure Laterality Date  . APPENDECTOMY    . AUGMENTATION MAMMAPLASTY  2009  . BREAST ENH[place]EMENT SURGERY  [place]  . CHOLECYSTECTOMY    . COLONOSCOPY    . Dental tissue graft  08/11/2017   for receding gums  . DILATION AND CURETTAGE OF UTERUS    . OVARIAN CYST REMOVAL      times 2  . TONSILLECTOMY    . tummy tuck  2009   [place]    Family History  Problem Relation Age of Onset  . Stomach cancer Mother   . Hypertension Father   . Cirrhosis Father   . Alcoholism Sister   . Schizophrenia Brother   . Stomach cancer Paternal Uncle   . Breast cancer Paternal Grandmother   . Colon cancer Neg Hx   . Rectal cancer Neg Hx   . Esophageal cancer Neg Hx     Social History   Socioeconomic History  . Marital status: Divorced    Spouse name: Not on file  . Number of children: 2  . Years of education: Not on file  . Highest education level: Not on file  Occupational History  . Occupation: [employer] Teacher  Tobacco Use  . Smoking status: Former    Current packs/day: 0.00    Types: Cigarettes    Quit date: 01/22/1987    Years since quitting: 36.4  . Smokeless tobacco: Never  Vaping Use  . Vaping status: Never Used  Substance and Sexual Activity  . Alcohol use: Yes    Comment: WEEKENDS  . Drug use: No  . Sexual activity: Not Currently    Birth control/protection: Post-menopausal,  Abstinence    Comment: 1st intercourse- 20, partners- less than 5  Other Topics Concern  . 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  Constitutional:      General: She is not in acute distress.    Appearance: Normal appearance. She is normal weight.  HENT:     Head:  Normocephalic and atraumatic.     Right Ear: External ear normal.     Left Ear: External ear normal.     Nose: Nose normal.     Mouth/Throat:     Mouth: Mucous membranes are moist.     Pharynx: Oropharynx is clear.  Eyes:     Extraocular Movements: Extraocular movements intact.     Pupils: Pupils are equal, round, and reactive to light.  Cardiovascular:     Rate and Rhythm: Normal rate and regular rhythm.     Pulses: Normal pulses.     Heart sounds: Normal heart sounds.  Pulmonary:     Effort: Pulmonary effort is normal. No respiratory distress.     Breath sounds: Normal breath sounds. No wheezing, rhonchi or rales.  Musculoskeletal:        General: Normal range of motion.     Cervical back: Normal range of motion.     Right lower leg: No edema.     Left lower leg: No edema.  Lymphadenopathy:     Cervical: No cervical adenopathy.  Neurological:     General: No focal deficit present.     Mental Status: She is alert and oriented to person, place, and time.  Psychiatric:        Mood and Affect: Mood normal.        Thought Content: Thought content normal.       Assessment & Plan:   Gastroesophageal reflux disease without esophagitis  Acquired hypothyroidism  Medication management     No follow-ups on file.   Wellington Half, FNP

## 2023-06-19 NOTE — Patient Instructions (Incomplete)

## 2023-06-21 ENCOUNTER — Encounter: Payer: Self-pay | Admitting: Family Medicine

## 2023-06-21 ENCOUNTER — Ambulatory Visit (INDEPENDENT_AMBULATORY_CARE_PROVIDER_SITE_OTHER): Payer: Managed Care, Other (non HMO) | Admitting: Family Medicine

## 2023-06-21 VITALS — BP 102/72 | HR 73 | Temp 98.3°F | Ht 62.0 in | Wt 115.6 lb

## 2023-06-21 DIAGNOSIS — R5383 Other fatigue: Secondary | ICD-10-CM | POA: Insufficient documentation

## 2023-06-21 DIAGNOSIS — Z79899 Other long term (current) drug therapy: Secondary | ICD-10-CM | POA: Insufficient documentation

## 2023-06-21 DIAGNOSIS — K219 Gastro-esophageal reflux disease without esophagitis: Secondary | ICD-10-CM

## 2023-06-21 DIAGNOSIS — E559 Vitamin D deficiency, unspecified: Secondary | ICD-10-CM | POA: Diagnosis not present

## 2023-06-21 DIAGNOSIS — Z136 Encounter for screening for cardiovascular disorders: Secondary | ICD-10-CM | POA: Insufficient documentation

## 2023-06-21 DIAGNOSIS — E538 Deficiency of other specified B group vitamins: Secondary | ICD-10-CM | POA: Insufficient documentation

## 2023-06-21 DIAGNOSIS — G4709 Other insomnia: Secondary | ICD-10-CM | POA: Insufficient documentation

## 2023-06-21 DIAGNOSIS — E039 Hypothyroidism, unspecified: Secondary | ICD-10-CM | POA: Diagnosis not present

## 2023-06-21 DIAGNOSIS — I959 Hypotension, unspecified: Secondary | ICD-10-CM | POA: Diagnosis not present

## 2023-06-21 MED ORDER — LORAZEPAM 2 MG PO TABS: 2.0000 mg | ORAL_TABLET | Freq: Every evening | ORAL | 1 refills | Status: AC | PRN

## 2023-06-21 MED ORDER — LEVOTHYROXINE SODIUM 75 MCG PO TABS: 75.0000 ug | ORAL_TABLET | Freq: Every day | ORAL | 3 refills | Status: DC

## 2023-06-21 MED ORDER — VALACYCLOVIR HCL 1 G PO TABS: 2000.0000 mg | ORAL_TABLET | Freq: Once | ORAL | 0 refills | Status: DC

## 2023-06-21 NOTE — Assessment & Plan Note (Signed)
 CBC, CMP Related to hypotension? Discussed adding electrolytes to water to help stabilize blood pressure

## 2023-06-21 NOTE — Assessment & Plan Note (Signed)
-   Vitamin D levels today

## 2023-06-21 NOTE — Assessment & Plan Note (Signed)
 May continue as needed Ativan  for now Discussed adding in magnesium supplementation

## 2023-06-21 NOTE — Assessment & Plan Note (Signed)
 B12 levels today

## 2023-06-21 NOTE — Assessment & Plan Note (Signed)
 Discussed adding salt in electrolytes to fluids Will recheck in about a month

## 2023-06-21 NOTE — Assessment & Plan Note (Signed)
Lipids today

## 2023-06-21 NOTE — Assessment & Plan Note (Signed)
 Refilled levothyroxine, TSH today

## 2023-06-21 NOTE — Assessment & Plan Note (Signed)
 Labs today, will dose adjust medications as needed

## 2023-06-22 ENCOUNTER — Ambulatory Visit: Payer: Self-pay | Admitting: Family Medicine

## 2023-06-22 LAB — TSH: TSH: 2.28 u[IU]/mL (ref 0.35–5.50)

## 2023-06-22 LAB — COMPREHENSIVE METABOLIC PANEL WITH GFR
ALT: 18 U/L (ref 0–35)
AST: 20 U/L (ref 0–37)
Albumin: 4.4 g/dL (ref 3.5–5.2)
Alkaline Phosphatase: 58 U/L (ref 39–117)
BUN: 20 mg/dL (ref 6–23)
CO2: 27 meq/L (ref 19–32)
Calcium: 9.3 mg/dL (ref 8.4–10.5)
Chloride: 103 meq/L (ref 96–112)
Creatinine, Ser: 0.7 mg/dL (ref 0.40–1.20)
GFR: 92.3 mL/min (ref >60.00)
Glucose, Bld: 89 mg/dL (ref 70–99)
Potassium: 4.3 meq/L (ref 3.5–5.1)
Sodium: 137 meq/L (ref 135–145)
Total Bilirubin: 0.7 mg/dL (ref 0.2–1.2)
Total Protein: 6.8 g/dL (ref 6.0–8.3)

## 2023-06-22 LAB — CBC WITH DIFFERENTIAL/PLATELET
Basophils Absolute: 0.1 10*3/uL (ref 0.0–0.1)
Basophils Relative: 1.4 % (ref 0.0–3.0)
Eosinophils Absolute: 0.2 10*3/uL (ref 0.0–0.7)
Eosinophils Relative: 2.9 % (ref 0.0–5.0)
HCT: 38.8 % (ref 36.0–46.0)
Hemoglobin: 13.1 g/dL (ref 12.0–15.0)
Lymphocytes Relative: 46.6 % — ABNORMAL HIGH (ref 12.0–46.0)
Lymphs Abs: 3.7 10*3/uL (ref 0.7–4.0)
MCHC: 33.6 g/dL (ref 30.0–36.0)
MCV: 90.4 fl (ref 78.0–100.0)
Monocytes Absolute: 0.6 10*3/uL (ref 0.1–1.0)
Monocytes Relative: 7 % (ref 3.0–12.0)
Neutro Abs: 3.4 10*3/uL (ref 1.4–7.7)
Neutrophils Relative %: 42.1 % — ABNORMAL LOW (ref 43.0–77.0)
Platelets: 313 10*3/uL (ref 150.0–400.0)
RBC: 4.29 Mil/uL (ref 3.87–5.11)
RDW: 13.6 % (ref 11.5–15.5)
WBC: 8 10*3/uL (ref 4.0–10.5)

## 2023-06-22 LAB — VITAMIN B12: Vitamin B-12: 740 pg/mL (ref 211–911)

## 2023-06-22 LAB — LIPID PANEL
Cholesterol: 179 mg/dL (ref 0–200)
HDL: 71.4 mg/dL (ref >39.00)
LDL Cholesterol: 94 mg/dL (ref 0–99)
NonHDL: 107.18
Total CHOL/HDL Ratio: 3
Triglycerides: 66 mg/dL (ref 0.0–149.0)
VLDL: 13.2 mg/dL (ref 0.0–40.0)

## 2023-06-22 LAB — VITAMIN D 25 HYDROXY (VIT D DEFICIENCY, FRACTURES): VITD: 33.3 ng/mL (ref 30.00–100.00)

## 2023-07-20 ENCOUNTER — Telehealth: Payer: Self-pay | Admitting: Family Medicine

## 2023-07-20 DIAGNOSIS — Z79899 Other long term (current) drug therapy: Secondary | ICD-10-CM

## 2023-07-20 NOTE — Telephone Encounter (Signed)
 Pt has called in for status update on refill, let pt know PCP is out of office and advised of the 48 hour window for rx refills

## 2023-07-20 NOTE — Telephone Encounter (Unsigned)
 Copied from CRM [id number]. Topic: Clinical - Medication Refill >> Jul 20, 2023  3:46 PM Precious C wrote: Medication: valACYclovir  (VALTREX ) 1000 MG tablet  Has the patient contacted their pharmacy? Yes  (Agent: If yes, when and what did the pharmacy advise?) Pt called today and pharmacy advised to contact PCP  This is the patient's preferred pharmacy:   [id number] [address], [place] - [address] [place] [address] Phone: [phone] Fax: [phone]  Is this the correct pharmacy for this prescription? Yes If no, delete pharmacy and type the correct one.   Has the prescription been filled recently? No  Is the patient out of the medication? Yes  Has the patient been seen for an appointment in the last year OR does the patient have an upcoming appointment? Yes  Can we respond through MyChart? Yes  Agent: Please be advised that Rx refills may take up to 3 business days. We ask that you follow-up with your pharmacy.

## 2023-07-22 NOTE — Telephone Encounter (Signed)
 Medication sent in.

## 2023-08-08 ENCOUNTER — Ambulatory Visit: Payer: Managed Care, Other (non HMO) | Admitting: Obstetrics and Gynecology

## 2023-08-29 ENCOUNTER — Other Ambulatory Visit: Payer: Self-pay

## 2023-08-29 ENCOUNTER — Telehealth: Payer: Self-pay

## 2023-08-29 DIAGNOSIS — F419 Anxiety disorder, unspecified: Secondary | ICD-10-CM

## 2023-08-29 NOTE — Telephone Encounter (Signed)
 Patient notified via phone call

## 2023-08-29 NOTE — Telephone Encounter (Signed)
 Referral placed.

## 2023-08-29 NOTE — Telephone Encounter (Signed)
 Copied from CRM #[id number]. Topic: Referral - Request for Referral >> Aug 29, 2023 11:41 AM Drema MATSU wrote: Did the patient discuss referral with their provider in the last year? No (If No - schedule appointment) (If Yes - send message)  Appointment offered? No  Type of order/referral and detailed reason for visit: referral to psychiatry for anxiety, nervous, insecure etc  Preference of office, provider, location: [facility] [address] [place] [place] [address] Dr. Elna Lo  If referral order, have you been seen by this specialty before? Yes (If Yes, this issue or another issue? When? Where? 20 years ago   Can we respond through MyChart? Yes

## 2023-09-01 DIAGNOSIS — F4322 Adjustment disorder with anxiety: Secondary | ICD-10-CM | POA: Diagnosis not present

## 2023-09-09 ENCOUNTER — Ambulatory Visit: Payer: Self-pay

## 2023-09-09 ENCOUNTER — Ambulatory Visit: Payer: Self-pay | Admitting: Family Medicine

## 2023-09-09 NOTE — Telephone Encounter (Signed)
 FYI Only or Action Required?: Action required by provider: clinical question for provider.  Patient was last seen in primary care on 06/21/2023 by Alvia Corean CROME, FNP.  Called Nurse Triage reporting Cough.  Symptoms began a week ago.  Triage Disposition: See Physician Within 24 Hours  Patient/caregiver understands and will follow disposition?: No                 Copied from CRM #[id number]. Topic: Clinical - Medication Question >> Sep 09, 2023  8:56 AM Mercedes MATSU wrote: Reason for CRM: Patient is calling requesting something to prescribed and sent to her pharmacy. She states that she is feeling sick, and that she is unable to come to the office. Patient symptoms include mucus, cough, head aches. Patient states that she was also around her nephew who is visiting from [place] and he was sick, and she feels whatever he had he gave to her. 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 Norleen and she declined. Patient is requesting a call back and can be reached at [phone]. She states its a secure line and if she ius unreachable to please leave a message. Reason for Disposition  SEVERE coughing spells (e.g., whooping sound after coughing, vomiting after coughing)  Answer Assessment - Initial Assessment Questions Pt does not want to come in for an appt as she has training this afternoon. [facility]   Pt states her nephew came back from [place] and had chills x2 days; pt was with him in the car and is worried she caught something from him  Onset: Wednesday night last week  Productive cough Denies fever Green mucus Body aches Stuffy nose Headache- heaviness   Pt also wants to let PCP know she was prescribed clonazepam 0.5 mg at night by her psychiatrist.  Protocols used: Cough - Acute Productive-A-AH

## 2023-09-09 NOTE — Telephone Encounter (Signed)
 Reason for Disposition . Third attempt to contact caller AND no contact made. Phone number verified. . Message left on identified voice mail  Protocols used: No Contact or Duplicate Contact Call-A-AH

## 2023-09-09 NOTE — Telephone Encounter (Addendum)
  3rd attempt lvmtcb. Per workflow, routing to practice for no contact X 3 follow up.  2nd attempt lvmtcb 1st attempt lvmtcb  Copied from CRM #[id number]. Topic: Clinical - Medication Question >> Sep 09, 2023  8:56 AM Sarah Zhang wrote: Reason for CRM: Patient is calling requesting something to prescribed and sent to her pharmacy. She states that she is feeling sick, and that she is unable to come to the office. Patient symptoms include mucus, cough, head aches. Patient states that she was also around her nephew who is visiting from [place] and he was sick, and she feels whatever he had he gave to her. 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 Norleen and she declined. Patient is requesting a call back and can be reached at [phone]. She states its a secure line and if she ius unreachable to please leave a message.

## 2023-09-19 DIAGNOSIS — F4322 Adjustment disorder with anxiety: Secondary | ICD-10-CM | POA: Diagnosis not present

## 2023-09-22 DIAGNOSIS — E039 Hypothyroidism, unspecified: Secondary | ICD-10-CM | POA: Diagnosis not present

## 2023-09-27 DIAGNOSIS — R079 Chest pain, unspecified: Secondary | ICD-10-CM | POA: Diagnosis not present

## 2023-09-27 DIAGNOSIS — E039 Hypothyroidism, unspecified: Secondary | ICD-10-CM | POA: Diagnosis not present

## 2023-10-09 NOTE — Progress Notes (Signed)
 Cardiology [facility] Office Note:    Date:  10/11/2023   ID:  Sarah Zhang, DOB 15-Jul-1960, MRN [id number]  PCP:  Alvia Corean CROME, FNP   [facility] Providers Cardiologist:  Jerel Balding, MD Cardiology APP:  Madie Jon Garre, PA     Referring MD: Roy Harlene HERO, NP   Chief Complaint  Patient presents with   New Patient (Initial Visit)    Chest pain    History of Present Illness:    Sarah Zhang is a 63 y.o. female with a hx of hypothyroidism, anxiety, hyperlipidemia, and GERD.  At her recent visit she reported intermittent left-sided chest pain at rest.  She was referred for cardiology evaluation.  He presents today to establish with cardiology. She describes anxiety and SOB that prompted UC visit 1 year ago, prescribed inhaler. 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   She recounts 3 weeks ago chest pain woke her from sleep three times in one night. She saw PCP for thyroid  and PCP recommended cardiology follow up. This occurred in the setting of taking klonopin, which she didn't feel well on, now back on lorazepam  and CP has not recurred.   She takes 1 mg lorazepam  at night for anxiety and sleep.   She exercise regularly, teaches activity at school. She goes to [employer], yoga and elliptical machine.   She is a Runner, broadcasting/film/video at [employer]  Ex-husband has cancer and is in [place].  She is a former smoker, only smoked socially at a young age. Drinks alcohol occasionally. No THC, no illicit drugs.  Family history: Sister had open heart surgery, sounds like rheumatic heart disease prompting valve replacement She is from [place], came her at age 42 yo. She has 2 children in [place]. She has 2 grandchildren (9 and 6).   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   Anxiety    Diverticulosis    GERD (gastroesophageal reflux disease)    Hiatal hernia    HSV-1  infection    Hypothyroidism    Insomnia    Osteoarthritis    Scoliosis     Past Surgical History:  Procedure Laterality Date   APPENDECTOMY     AUGMENTATION MAMMAPLASTY  2009   BREAST [place]   CHOLECYSTECTOMY     COLONOSCOPY     Dental tissue graft  08/11/2017   for receding gums   DILATION AND CURETTAGE OF UTERUS     OVARIAN CYST REMOVAL     times 2   TONSILLECTOMY     tummy tuck  2009   [place]    Current Medications: Current Meds  Medication Sig   B Complex Vitamins (VITAMIN-B COMPLEX PO) Take 1 capsule by mouth daily.   Cholecalciferol (VITAMIN D  PO) Take by mouth.     fish oil-omega-3 fatty acids 1000 MG capsule Take 1 g by mouth daily.     levothyroxine  (SYNTHROID ) 75 MCG tablet Take 1 tablet (75 mcg total) by mouth daily before breakfast.   LORazepam  (ATIVAN ) 2 MG tablet Take 1 tablet (2 mg total) by mouth at bedtime as needed for anxiety.   metoprolol  tartrate (LOPRESSOR ) 50 MG tablet Take 1 tablet 2 hours prior to cardiac CT   Multiple Vitamin (MULTIVITAMIN) capsule Take 1 capsule by mouth daily.     valACYclovir  (VALTREX ) 1000 MG tablet Take 2,000 mg by  mouth once. (Patient taking differently: Take 2,000 mg by mouth as needed. Fever Blister)   vitamin E 400 UNIT capsule Take 400 Units by mouth daily.       Allergies:   Codeine and Sulfonamide derivatives   Social History   Socioeconomic History   Marital status: Divorced    Spouse name: Not on file   Number of children: 2   Years of education: Not on file   Highest education level: Not on file  Occupational History   Occupation: Electrical engineer  Tobacco Use   Smoking status: Former    Current packs/day: 0.00    Types: Cigarettes    Quit date: 01/22/1987    Years since quitting: 36.7   Smokeless tobacco: Never  Vaping Use   Vaping status: Never Used  Substance and Sexual Activity   Alcohol use: Yes    Comment: WEEKENDS   Drug use: No   Sexual  activity: Not Currently    Birth control/protection: Post-menopausal, Abstinence    Comment: 1st intercourse- 20, partners- less than 5  Other Topics Concern   Not on file  Social History Narrative   Pt is ambidextrous - prefers L hand for writing, R hand for all other activities   Lives alone in split level home    Has 2 adult daughters   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 There is no history of Colon cancer, Rectal cancer, or Esophageal cancer.  ROS:   Please see the history of present illness.     All other systems reviewed and are negative.  EKGs/Labs/Other Studies Reviewed:    The following studies were reviewed today:  EKG Interpretation Date/Time:  Tuesday October 11 2023 14:49:50 EDT Ventricular Rate:  75 PR Interval:  156 QRS Duration:  66 QT Interval:  380 QTC Calculation: 424 R Axis:   81  Text Interpretation: Normal sinus rhythm Septal infarct , age undetermined No previous ECGs available Confirmed by Madie Slough ([id number]) on 10/11/2023 3:08:03 PM    Recent Labs: 06/21/2023: ALT 18; BUN 20; Creatinine, Ser 0.70; Hemoglobin 13.1; Platelets 313.0; Potassium 4.3; Sodium 137; TSH 2.28  Recent Lipid Panel    Component Value Date/Time   CHOL 179 06/21/2023 1633   TRIG 66.0 06/21/2023 1633   HDL 71.40 06/21/2023 1633   CHOLHDL 3 06/21/2023 1633   VLDL 13.2 06/21/2023 1633   LDLCALC 94 06/21/2023 1633     Risk Assessment/Calculations:               Physical Exam:     VS:  BP (!) 80/60   Pulse 75   Ht 5' 2 (1.575 m)   Wt 116 lb 6.4 oz (52.8 kg)   LMP 06/20/2011 Comment: not sexually active  SpO2 99%   BMI 21.29 kg/m     Wt Readings from Last 3 Encounters:  10/11/23 116 lb 6.4 oz (52.8 kg)  06/21/23 115 lb 9.6 oz (52.4 kg)  10/15/22 115 lb (52.2 kg)  GEN:  Well nourished, well developed in no acute distress HEENT: Normal NECK: No JVD; No carotid bruits LYMPHATICS: No lymphadenopathy CARDIAC: RRR, no murmurs, rubs, gallops RESPIRATORY:  Clear to auscultation without rales, wheezing or rhonchi  ABDOMEN: Soft, non-tender, non-distended MUSCULOSKELETAL:  No edema; No deformity  SKIN: Warm and dry NEUROLOGIC:  Alert and oriented x 3 PSYCHIATRIC:  Normal affect   ASSESSMENT:    1. Chest pain, unspecified type   2. Precordial pain   3. Family history of heart valve abnormality   4. Hyperlipidemia with target LDL less than 70   5. Anxiety    PLAN:    In order of problems listed above:  Chest pain -- sounds more consistent with anxiety, is not exertional, but waking her from sleep - Will obtain CT coronary and echo -- EKG with septal Q waves, but no prior for comparison   Family history of ?valve issues  - will check echo   Dyslipidemia - LDL 94 - will discuss goals after studies obtained   Anxiety - taking PRN ativan  - likely contributing to chest pain   Will try to get both studies on the same day as she still works.  Follow up after studies.    Medication Adjustments/Labs and Tests Ordered: Current medicines are reviewed at length with the patient today.  Concerns regarding medicines are outlined above.  Orders Placed This Encounter  Procedures   CT CORONARY MORPH W/CTA COR W/SCORE W/CA W/CM &/OR WO/CM   Basic metabolic panel with GFR   CBC   EKG 12-Lead   ECHOCARDIOGRAM COMPLETE   Meds ordered this encounter  Medications   metoprolol  tartrate (LOPRESSOR ) 50 MG tablet    Sig: Take 1 tablet 2 hours prior  to cardiac CT    Dispense:  1 tablet    Refill:  0    Patient Instructions  Medication Instructions:  Metoprolol  Tartrate 50 mg take 1 tablet 2 hours prior to cardiac CT  *If you need a refill on your cardiac medications before your next appointment, please call your pharmacy*  Lab Work: BMET, CBC  Testing/Procedures: Your physician has requested that you have an echocardiogram. Echocardiography is a painless test that uses sound waves to create images of your heart. It provides your doctor with information about the size and shape of your heart and how well your heart's chambers and valves are working. This procedure takes approximately one hour. There are no restrictions for this procedure. Please do NOT wear cologne, perfume, aftershave, or lotions (deodorant is allowed). Please arrive 15 minutes prior to your appointment time.  Please note: We ask at that you not bring children with you during ultrasound (echo/ vascular) testing. Due to room size and safety concerns, children are not allowed in the ultrasound rooms during exams. Our front office staff cannot provide observation of children in our lobby area while testing is being conducted. An adult accompanying a patient to their appointment will only be allowed in the ultrasound room at the discretion of the ultrasound technician under special circumstances. We apologize for any inconvenience.    Your cardiac CT will be scheduled at one of the below locations:   [facility] [address]  [place], [place] [address] [phone]  If scheduled at the [facility] at [facility] street, please enter the parking lot using the [address] entrance and use the FREE valet service at the patient drop-off area. Enter the building and check-in with  registration on the main floor.  Please follow these instructions carefully (unless otherwise directed):  An IV will be required for this test and  Nitroglycerin will be given.  Hold all erectile dysfunction medications at least 3 days (72 hrs) prior to test. (Ie viagra, cialis, sildenafil, tadalafil, etc)   On the Night Before the Test: Be sure to Drink plenty of water. Do not consume any caffeinated/decaffeinated beverages or chocolate 12 hours prior to your test. Do not take any antihistamines 12 hours prior to your test.   On the Day of the Test: Drink plenty of water until 1 hour prior to the test. Do not eat any food 1 hour prior to test. You may take your regular medications prior to the test.  Take metoprolol  (Lopressor ) two hours prior to test. If you take Furosemide/Hydrochlorothiazide/Spironolactone/Chlorthalidone, please HOLD on the morning of the test. Patients who wear a continuous glucose monitor MUST remove the device prior to scanning. FEMALES- please wear underwire-free bra if available, avoid dresses & tight clothing        After the Test: Drink plenty of water. After receiving IV contrast, you may experience a mild flushed feeling. This is normal. On occasion, you may experience a mild rash up to 24 hours after the test. This is not dangerous. If this occurs, you can take Benadryl 25 mg, Zyrtec, Claritin, or Allegra and increase your fluid intake. (Patients taking Tikosyn should avoid Benadryl, and may take Zyrtec, Claritin, or Allegra) If you experience trouble breathing, this can be serious. If it is severe call 911 IMMEDIATELY. If it is mild, please call our office.  We will call to schedule your test 2-4 weeks out understanding that some insurance companies will need an authorization prior to the service being performed.   For more information and frequently asked questions, please visit our website : [email or web address]  For non-scheduling related questions, please contact the cardiac imaging nurse navigator should you have any questions/concerns: Cardiac Imaging Nurse Navigators Direct  Office Dial: [phone]   For scheduling needs, including cancellations and rescheduling, please call [place], [phone].    Follow-Up: At [facility], you and your health needs are our priority.  As part of our continuing mission to provide you with exceptional heart care, our providers are all part of one team.  This team includes your primary Cardiologist (physician) and Advanced Practice Providers or APPs (Physician Assistants and Nurse Practitioners) who all work together to provide you with the care you need, when you need it.  Your next appointment:   1 month(s)  Provider:   Jerel Balding, MD    We recommend signing up for the patient portal called MyChart.  Sign up information is provided on this After Visit Summary.  MyChart is used to connect with patients for Virtual Visits (Telemedicine).  Patients are able to view lab/test results, encounter notes, upcoming appointments, etc.  Non-urgent messages can be sent to your provider as well.   To learn more about what you can do with MyChart, go to [email or web address].   Other Instructions            Signed, Jon Nat Hails, [place]  10/11/2023 4:19 PM    [facility]

## 2023-10-11 ENCOUNTER — Encounter: Payer: Self-pay | Admitting: Physician Assistant

## 2023-10-11 ENCOUNTER — Ambulatory Visit: Attending: Physician Assistant | Admitting: Physician Assistant

## 2023-10-11 VITALS — BP 80/60 | HR 75 | Ht 62.0 in | Wt 116.4 lb

## 2023-10-11 DIAGNOSIS — R079 Chest pain, unspecified: Secondary | ICD-10-CM | POA: Diagnosis not present

## 2023-10-11 DIAGNOSIS — Z8249 Family history of ischemic heart disease and other diseases of the circulatory system: Secondary | ICD-10-CM

## 2023-10-11 DIAGNOSIS — E785 Hyperlipidemia, unspecified: Secondary | ICD-10-CM

## 2023-10-11 DIAGNOSIS — R072 Precordial pain: Secondary | ICD-10-CM

## 2023-10-11 DIAGNOSIS — F419 Anxiety disorder, unspecified: Secondary | ICD-10-CM | POA: Diagnosis not present

## 2023-10-11 MED ORDER — METOPROLOL TARTRATE 50 MG PO TABS: ORAL_TABLET | ORAL | 0 refills | Status: DC

## 2023-10-11 NOTE — Patient Instructions (Addendum)
 Medication Instructions:  Metoprolol  Tartrate 50 mg take 1 tablet 2 hours prior to cardiac CT  *If you need a refill on your cardiac medications before your next appointment, please call your pharmacy*  Lab Work: BMET, CBC  Testing/Procedures: Your physician has requested that you have an echocardiogram. Echocardiography is a painless test that uses sound waves to create images of your heart. It provides your doctor with information about the size and shape of your heart and how well your heart's chambers and valves are working. This procedure takes approximately one hour. There are no restrictions for this procedure. Please do NOT wear cologne, perfume, aftershave, or lotions (deodorant is allowed). Please arrive 15 minutes prior to your appointment time.  Please note: We ask at that you not bring children with you during ultrasound (echo/ vascular) testing. Due to room size and safety concerns, children are not allowed in the ultrasound rooms during exams. Our front office staff cannot provide observation of children in our lobby area while testing is being conducted. An adult accompanying a patient to their appointment will only be allowed in the ultrasound room at the discretion of the ultrasound technician under special circumstances. We apologize for any inconvenience.    Your cardiac CT will be scheduled at one of the below locations:   [facility] [address]  [place], [place] [address] [phone]  If scheduled at the [facility] at [facility] street, please enter the parking lot using the [address] entrance and use the FREE valet service at the patient drop-off area. Enter the building and check-in with registration on the main floor.  Please follow these instructions carefully (unless otherwise directed):  An IV will be required for this test and Nitroglycerin will be given.  Hold all erectile dysfunction medications at least  3 days (72 hrs) prior to test. (Ie viagra, cialis, sildenafil, tadalafil, etc)   On the Night Before the Test: Be sure to Drink plenty of water. Do not consume any caffeinated/decaffeinated beverages or chocolate 12 hours prior to your test. Do not take any antihistamines 12 hours prior to your test.   On the Day of the Test: Drink plenty of water until 1 hour prior to the test. Do not eat any food 1 hour prior to test. You may take your regular medications prior to the test.  Take metoprolol  (Lopressor ) two hours prior to test. If you take Furosemide/Hydrochlorothiazide/Spironolactone/Chlorthalidone, please HOLD on the morning of the test. Patients who wear a continuous glucose monitor MUST remove the device prior to scanning. FEMALES- please wear underwire-free bra if available, avoid dresses & tight clothing        After the Test: Drink plenty of water. After receiving IV contrast, you may experience a mild flushed feeling. This is normal. On occasion, you may experience a mild rash up to 24 hours after the test. This is not dangerous. If this occurs, you can take Benadryl 25 mg, Zyrtec, Claritin, or Allegra and increase your fluid intake. (Patients taking Tikosyn should avoid Benadryl, and may take Zyrtec, Claritin, or Allegra) If you experience trouble breathing, this can be serious. If it is severe call 911 IMMEDIATELY. If it is mild, please call our office.  We will call to schedule your test 2-4 weeks out understanding that some insurance companies will need an authorization prior to the service being performed.   For more information and frequently asked questions, please visit our website : [email or web address]  For  non-scheduling related questions, please contact the cardiac imaging nurse navigator should you have any questions/concerns: Cardiac Imaging Nurse Navigators Direct Office Dial: [phone]   For scheduling needs, including cancellations and  rescheduling, please call [place], [phone].    Follow-Up: At [facility], you and your health needs are our priority.  As part of our continuing mission to provide you with exceptional heart care, our providers are all part of one team.  This team includes your primary Cardiologist (physician) and Advanced Practice Providers or APPs (Physician Assistants and Nurse Practitioners) who all work together to provide you with the care you need, when you need it.  Your next appointment:   1 month(s)  Provider:   Jerel Balding, MD    We recommend signing up for the patient portal called MyChart.  Sign up information is provided on this After Visit Summary.  MyChart is used to connect with patients for Virtual Visits (Telemedicine).  Patients are able to view lab/test results, encounter notes, upcoming appointments, etc.  Non-urgent messages can be sent to your provider as well.   To learn more about what you can do with MyChart, go to [email or web address].   Other Instructions

## 2023-10-13 DIAGNOSIS — F411 Generalized anxiety disorder: Secondary | ICD-10-CM | POA: Diagnosis not present

## 2023-10-19 ENCOUNTER — Ambulatory Visit: Payer: Self-pay | Admitting: Obstetrics and Gynecology

## 2023-10-19 ENCOUNTER — Encounter: Payer: Self-pay | Admitting: Obstetrics and Gynecology

## 2023-10-19 ENCOUNTER — Ambulatory Visit (INDEPENDENT_AMBULATORY_CARE_PROVIDER_SITE_OTHER): Admitting: Obstetrics and Gynecology

## 2023-10-19 ENCOUNTER — Other Ambulatory Visit (HOSPITAL_COMMUNITY)
Admission: RE | Admit: 2023-10-19 | Discharge: 2023-10-19 | Disposition: A | Discharge status: Home / Self Care | Source: Ambulatory Visit | Attending: Obstetrics and Gynecology | Admitting: Obstetrics and Gynecology

## 2023-10-19 VITALS — BP 112/74 | HR 67

## 2023-10-19 DIAGNOSIS — N952 Postmenopausal atrophic vaginitis: Secondary | ICD-10-CM | POA: Diagnosis not present

## 2023-10-19 DIAGNOSIS — N898 Other specified noninflammatory disorders of vagina: Secondary | ICD-10-CM | POA: Insufficient documentation

## 2023-10-19 DIAGNOSIS — R300 Dysuria: Secondary | ICD-10-CM | POA: Diagnosis not present

## 2023-10-19 MED ORDER — ESTRADIOL 10 MCG VA TABS: 1.0000 | ORAL_TABLET | VAGINAL | 0 refills | Status: DC

## 2023-10-19 MED ORDER — NITROFURANTOIN MONOHYD MACRO 100 MG PO CAPS: 100.0000 mg | ORAL_CAPSULE | Freq: Two times a day (BID) | ORAL | 0 refills | Status: DC

## 2023-10-19 NOTE — Patient Instructions (Signed)
 Vaginal Dryness and Thinning (Atrophic Vaginitis): What to Know  Atrophic vaginitis is a condition where the lining of the vagina becomes thin, dry, and inflamed. This condition can make you more likely to: Get an infection. Have pain during sex. Have other uncomfortable symptoms. Tell your health care provider if you notice any changes in your vagina. They can help you find ways to feel better. They can also treat infections and suggest healthy habits for a healthy vagina. What are the causes? Atrophic vaginitis is caused by a drop in estrogen. It's more common when menstrual periods stop during menopause. This often starts between ages 44-55 and may get worse over time as estrogen levels get lower. Estrogen helps to: Keep the vagina moist. Lubricate the vagina during sex. Protect against infection. A drop in estrogen can lead to: Thinner and drier vaginal lining. A smaller, less stretchy vagina. What increases the risk? Many factors make you more likely to have vaginal dryness and thinning. These include: Taking medicines that block estrogen. Having your ovaries taken out. Cancer treatment, such as: Radiation. Chemotherapy. Having recently delivered a baby, especially if not a vaginal delivery. Breastfeeding. Being over age 59. Having an eating disorder. Smoking. What are the signs or symptoms? Pain, soreness, or bleeding during sex. Burning, irritation, or itching around your vagina. Loss of interest in sex. Burning pain while peeing or peeing often. Vaginal discharge. It may be: White. [place]. Yellow. Blood-tinged. Thick. Watery. Sometimes, there are no symptoms. How is this diagnosed? This condition is diagnosed based on: Your medical history. A pelvic exam to check the tissue in the vagina. Rarely, you may have more tests. These include: A pee test to check for a urinary tract infection. An acid balance check in the vagina. How is this treated? Treatment depends  on your symptoms. Treatment may include: Lubricants for the vagina. Long-acting moisturizers. Low-dose estrogen. These come in: Creams. Tablets. Vaginal rings. Treatment may not be needed if your symptoms are mild and you're not having sex. Talk to your provider about any history of breast cancer, endometrial cancer, or blood clots. Follow these instructions at home: Medicines Take your medicines only as told. Do not use herbal or other medicines unless your provider says it's safe. Use creams, lubricants, or moisturizers only as told. General instructions If your dryness and thinning is related to menopause, talk about your symptoms and treatment options with your provider. Do not douche. Do not use products that make your vagina dry. These include: Scented sprays. Tampons. Soaps. Use water-soluble lubricant or moisturizer if sex is painful. Having sex more often can improve blood flow and make the vaginal tissue more stretchy. Contact a health care provider if: Your discharge from your vagina changes in color and has a smell. You have new symptoms. Your symptoms don't get better with treatment. Your symptoms get worse. This information is not intended to replace advice given to you by your health care provider. Make sure you discuss any questions you have with your health care provider. Document Revised: 08/23/2022 Document Reviewed: 08/23/2022 Elsevier Patient Education  2024 [employer] Inc.  Urinary Tract Infection, Female A urinary tract infection (UTI) is an infection in your urinary tract. The urinary tract is made up of organs that make, store, and get rid of pee (urine) in your body. These organs include: The kidneys. The ureters. The bladder. The urethra. What are the causes? Most UTIs are caused by germs called bacteria. They may be in or near your genitals. These germs  grow and cause swelling in your urinary tract. What increases the risk? You're more likely to get  a UTI if: You're a female. The urethra is shorter in females than in males. You have a soft tube called a catheter that drains your pee. You can't control when you pee or poop. You have trouble peeing because of: A kidney stone. A urinary blockage. A nerve condition that affects your bladder. Not getting enough to drink. You're sexually active. You use a birth control inside your vagina, like spermicide. You're pregnant. You have low levels of the hormone estrogen in your body. You're an older adult. You're also more likely to get a UTI if you have other health problems. These may include: Diabetes. A weak immune system. Your immune system is your body's defense system. Sickle cell disease. Injury of the spine. What are the signs or symptoms? Symptoms may include: Needing to pee right away. Peeing small amounts often. Pain or burning when you pee. Blood in your pee. Pee that smells bad or odd. Pain in your belly or lower back. You may also: Feel confused. This may be the first symptom in older adults. Vomit. Not feel hungry. Feel tired or easily annoyed. Have a fever or chills. How is this diagnosed? A UTI is diagnosed based on your medical history and an exam. You may also have other tests. These may include: Pee tests. Blood tests. Tests for sexually transmitted infections (STIs). If you've had more than one UTI, you may need to have imaging studies done to find out why you keep getting them. How is this treated? A UTI can be treated by: Taking antibiotics or other medicines. Drinking enough fluid to keep your pee pale yellow. In rare cases, a UTI can cause a very bad condition called sepsis. Sepsis may be treated in the hospital. Follow these instructions at home: Medicines Take your medicines only as told by your health care provider. If you were given antibiotics, take them as told by your provider. Do not stop taking them even if you start to feel better. General  instructions Make sure you: Pee often and fully. Do not hold your pee for a long time. Wipe from front to back after you pee or poop. Use each tissue only once when you wipe. Pee after you have sex. Do not douche or use sprays or powders in your genital area. Contact a health care provider if: Your symptoms don't get better after 1-2 days of taking antibiotics. Your symptoms go away and then come back. You have a fever or chills. You vomit or feel like you may vomit. Get help right away if: You have very bad pain in your back or lower belly. You faint. This information is not intended to replace advice given to you by your health care provider. Make sure you discuss any questions you have with your health care provider. Document Revised: 12/22/2022 Document Reviewed: 04/16/2022 Elsevier Patient Education  2025 [employer].

## 2023-10-19 NOTE — Progress Notes (Signed)
 GYNECOLOGY  VISIT   HPI: 63 y.o.   Divorced  Hispanic female   [phone] with Patient's last menstrual period was 06/20/2011.   here for: Possible UTI. Pelvic pain, burning with urination, urinary discomfort, urinary frequency and urgency. started 2 days ago.  Normal vaginal discharge.  Some itching.    Not sexually active for a few years.    Hx genital condyloma removed.   Used vaginal cream in the past, expensive.    From [place].  Teacher at [employer].  Ex-husband has cancer.   GYNECOLOGIC HISTORY: Patient's last menstrual period was 06/20/2011. Contraception:  PMP Menopausal hormone therapy:  n/a Last 2 paps:  08/03/22 neg HR HPV neg, 07/22/21 neg  History of abnormal Pap or positive HPV:  no Mammogram:  09/22/22 Breast Density Cat C, BIRADS Cat 1 neg         OB History     Gravida  3   Para  2   Term  2   Preterm      AB  1   Living  2      SAB  1   IAB      Ectopic      Multiple      Live Births  2              Patient Active Problem List   Diagnosis Date Noted   Medication management 06/21/2023   Encounter for screening for cardiovascular disorders 06/21/2023   Vitamin D  deficiency 06/21/2023   Vitamin B 12 deficiency 06/21/2023   Other fatigue 06/21/2023   Arterial hypotension 06/21/2023   Other insomnia 06/21/2023   Acquired hypothyroidism 06/19/2023   CHRONIC RHINOSINUSITIS 03/06/2010   CHANGE IN BOWELS 03/06/2010   DIARRHEA-PRESUMED INFECTIOUS 02/10/2010   GERD 02/10/2010   NAUSEA AND VOMITING 02/10/2010   FLATULENCE-GAS-BLOATING 02/10/2010   ABDOMINAL PAIN-EPIGASTRIC 02/10/2010   ABDOMINAL PAIN -GENERALIZED 02/10/2010    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   Anxiety    Diverticulosis    GERD (gastroesophageal reflux disease)    Hiatal hernia    HSV-1 infection    Hypothyroidism    Insomnia    Osteoarthritis    Scoliosis     Past Surgical History:  Procedure Laterality Date    APPENDECTOMY     AUGMENTATION MAMMAPLASTY  2009   BREAST ENHANCEMENT SURGERY  2009   [place]   CHOLECYSTECTOMY     COLONOSCOPY     Dental tissue graft  08/11/2017   for receding gums   DILATION AND CURETTAGE OF UTERUS     OVARIAN CYST REMOVAL     times 2   TONSILLECTOMY     tummy tuck  2009   [place]    Current Outpatient Medications  Medication Sig Dispense Refill   B Complex Vitamins (VITAMIN-B COMPLEX PO) Take 1 capsule by mouth daily.     Cholecalciferol (VITAMIN D  PO) Take by mouth.       [START ON 10/20/2023] Estradiol  (VAGIFEM ) 10 MCG TABS vaginal tablet Place 1 tablet (10 mcg total) vaginally 2 (two) times a week. Use every night before bed for two weeks when you first begin this medicine, then after the first two weeks, begin using it twice a week. 34 tablet 0   fish oil-omega-3 fatty acids 1000 MG capsule Take 1 g by mouth daily.       levothyroxine  (SYNTHROID ) 75 MCG tablet Take 1 tablet (75 mcg total) by  mouth daily before breakfast. 30 tablet 3   LORazepam  (ATIVAN ) 2 MG tablet Take 1 tablet (2 mg total) by mouth at bedtime as needed for anxiety. 30 tablet 1   nitrofurantoin , macrocrystal-monohydrate, (MACROBID ) 100 MG capsule Take 1 capsule (100 mg total) by mouth 2 (two) times daily. Take for 5 days. 10 capsule 0   valACYclovir  (VALTREX ) 1000 MG tablet Take 2,000 mg by mouth once. (Patient taking differently: Take 2,000 mg by mouth as needed. Fever Blister)     vitamin E 400 UNIT capsule Take 400 Units by mouth daily.       metoprolol  tartrate (LOPRESSOR ) 50 MG tablet Take 1 tablet 2 hours prior to cardiac CT (Patient not taking: Reported on 10/19/2023) 1 tablet 0   Multiple Vitamin (MULTIVITAMIN) capsule Take 1 capsule by mouth daily.   (Patient not taking: Reported on 10/19/2023)     No current facility-administered medications for this visit.     ALLERGIES: Codeine and Sulfonamide derivatives  Family History  Problem Relation Age of Onset   Stomach  cancer Mother    Hypertension Father    Cirrhosis Father    Alcoholism Sister    Schizophrenia Brother    Stomach cancer Paternal Uncle    Breast cancer Paternal Grandmother    Colon cancer Neg Hx    Rectal cancer Neg Hx    Esophageal cancer Neg Hx     Social History   Socioeconomic History   Marital status: Divorced    Spouse name: Not on file   Number of children: 2   Years of education: Not on file   Highest education level: Not on file  Occupational History   Occupation: Electrical engineer  Tobacco Use   Smoking status: Former    Current packs/day: 0.00    Types: Cigarettes    Quit date: 01/22/1987    Years since quitting: 36.7   Smokeless tobacco: Never  Vaping Use   Vaping status: Never Used  Substance and Sexual Activity   Alcohol use: Yes    Comment: WEEKENDS   Drug use: No   Sexual activity: Not Currently    Birth control/protection: Post-menopausal, Abstinence    Comment: 1st intercourse- 20, partners- less than 5  Other Topics Concern   Not on file  Social History Narrative   Pt is ambidextrous - prefers L hand for writing, R hand for all other activities   Lives alone in split level home    Has 2 adult daughters   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  All other systems reviewed and are negative.   PHYSICAL EXAMINATION:   BP 112/74 (BP Location: Left Arm, Patient Position: Sitting)   Pulse 67   LMP 06/20/2011 Comment: not sexually active  SpO2 100%     General appearance: alert, cooperative and appears stated age   Pelvic: External genitalia:  no  lesions.  Sebaceous cyst of left perianal region - 4 mm.  No condyloma.               Urethra:  normal appearing urethra with no masses,  tenderness or lesions              Bartholins and Skenes: normal                 Vagina: normal appearing vagina with normal color and discharge, no lesions.  Atrophy and bleeding noted from contact with speculum/Q-tip.              Cervix: no lesions                Bimanual Exam:  Uterus:  normal size, contour, position, consistency, mobility, non-tender              Adnexa: no mass, fullness, tenderness   Chaperone was present for exam:  Kari HERO, CMA  ASSESSMENT:  Dysuria.  Vaginal itching.  Vaginal atrophy.   PLAN:  Urinalysis:  sg 1.015, ph 5.0, 10 - 20 WBC, 0 - 2 RBC, few bacterial, 0 - 5 squam cells Reflex culture.  Macrobid  100 mg po bid x 5 days.  Vaginitis swab.  Start Vagifem  after completing Macrobid .  Discussed potential effect on breast cancer.  Update mammogram in order to receive refills of Vagifem .  FU for annual exam and prn.   30 min  total time was spent for this patient encounter, including preparation, face-to-face counseling with the patient, coordination of care, and documentation of the encounter.

## 2023-10-21 LAB — CERVICOVAGINAL ANCILLARY ONLY
Bacterial Vaginitis (gardnerella): NEGATIVE
Candida Glabrata: NEGATIVE
Candida Vaginitis: NEGATIVE
Comment: NEGATIVE
Comment: NEGATIVE
Comment: NEGATIVE
Comment: NEGATIVE
Trichomonas: NEGATIVE

## 2023-10-21 LAB — URINALYSIS, COMPLETE W/RFL CULTURE
Bilirubin Urine: NEGATIVE
Glucose, UA: NEGATIVE
Hyaline Cast: NONE SEEN /LPF
Ketones, ur: NEGATIVE
Nitrites, Initial: NEGATIVE
Protein, ur: NEGATIVE
Specific Gravity, Urine: 1.015 (ref 1.001–1.035)
pH: 5 (ref 5.0–8.0)

## 2023-10-21 LAB — URINE CULTURE
MICRO NUMBER:: 17010911
SPECIMEN QUALITY:: ADEQUATE

## 2023-10-21 LAB — CULTURE INDICATED

## 2023-10-27 DIAGNOSIS — L820 Inflamed seborrheic keratosis: Secondary | ICD-10-CM | POA: Diagnosis not present

## 2023-10-27 DIAGNOSIS — D485 Neoplasm of uncertain behavior of skin: Secondary | ICD-10-CM | POA: Diagnosis not present

## 2023-10-27 DIAGNOSIS — B0089 Other herpesviral infection: Secondary | ICD-10-CM | POA: Diagnosis not present

## 2023-10-27 DIAGNOSIS — L821 Other seborrheic keratosis: Secondary | ICD-10-CM | POA: Diagnosis not present

## 2023-11-04 ENCOUNTER — Other Ambulatory Visit: Payer: Self-pay

## 2023-11-04 NOTE — Telephone Encounter (Addendum)
  Med refill request:  90 day prescription request of Yuvafem  10 mcg vaginal insert   Last OV:  10/19/23 Last AEX:  08/03/22 ([place]) Next AEX:  02/27/24 Last MMG (if hormonal med):  09/22/22 Refill authorized? Please Advise.

## 2023-11-06 MED ORDER — ESTRADIOL 10 MCG VA TABS: 1.0000 | ORAL_TABLET | VAGINAL | 1 refills | Status: DC

## 2023-11-06 NOTE — Telephone Encounter (Signed)
 I will refill Yuvfem.   Please have patient her mammogram if not already done for this year.

## 2023-11-07 ENCOUNTER — Other Ambulatory Visit: Payer: Self-pay | Admitting: Obstetrics and Gynecology

## 2023-11-07 DIAGNOSIS — Z1231 Encounter for screening mammogram for malignant neoplasm of breast: Secondary | ICD-10-CM

## 2023-11-07 NOTE — Telephone Encounter (Signed)
 Call placed to patient, left detailed message, ok per dpr. Advised per Dr. Nikki, return call to office if any additional questions.

## 2023-11-07 NOTE — Telephone Encounter (Signed)
 Spoke with patient. MMG scheduled 11/24/23.  OOP cost for 90 day supply of estradiol  vaginal tablet approximately $90, confirmed with [facility].   Patient states this is too expensive, she will contact insurance to review covered alternatives and return call to provide options if needed. Advised I will also send update to Dr. Nikki and return call if any additional recommendations. Patient agreeable.

## 2023-11-08 MED ORDER — PREMARIN 0.625 MG/GM VA CREA: TOPICAL_CREAM | VAGINAL | 0 refills | Status: DC

## 2023-11-08 NOTE — Addendum Note (Signed)
 Addended by: BRUTUS KATE SAILOR on: 11/08/2023 04:16 PM   Modules accepted: Orders

## 2023-11-08 NOTE — Telephone Encounter (Signed)
 Spoke with patient, questions answers.   Patient is requesting Rx for Premarin  cream [facility]. She is going to have estradiol  transferred to [facility], would like to compare cost. States once she determines which she would like to proceed with she will call to advise. Advised I will provide update to Dr. Nikki and f/u. Patient agreeable.

## 2023-11-08 NOTE — Telephone Encounter (Signed)
 Pt called Sarah Zhang back to apologize for not picking up when she was called, as Pt was in a school meeting. She is asking that someone please call her back when they can.

## 2023-11-08 NOTE — Telephone Encounter (Signed)
 Call returned patient, Left message to call [facility] Triage at [phone], option 4.

## 2023-11-09 ENCOUNTER — Telehealth: Payer: Self-pay

## 2023-11-09 NOTE — Telephone Encounter (Addendum)
 Med refill request:  Yuvafem  10 mcg vaginal insert  *Pt is requesting a 90 day supply.  Last OV:  10/19/23 Last AEX:  08/03/22 ([place]) Next AEX:  02/27/24 Last MMG (if hormonal med):  09/22/22 Refill authorized? Please Advise.

## 2023-11-10 ENCOUNTER — Telehealth (HOSPITAL_COMMUNITY): Payer: Self-pay | Admitting: *Deleted

## 2023-11-10 DIAGNOSIS — F411 Generalized anxiety disorder: Secondary | ICD-10-CM | POA: Diagnosis not present

## 2023-11-10 NOTE — Telephone Encounter (Signed)
 Attempted to call patient regarding upcoming cardiac CT appointment. Left message on voicemail with name and callback number Sid Seats RN Navigator Cardiac Imaging [facility] [phone] Office

## 2023-11-11 ENCOUNTER — Encounter (HOSPITAL_COMMUNITY): Payer: Self-pay

## 2023-11-11 ENCOUNTER — Ambulatory Visit (HOSPITAL_COMMUNITY)
Admission: RE | Admit: 2023-11-11 | Discharge: 2023-11-11 | Disposition: A | Discharge status: Home / Self Care | Source: Ambulatory Visit | Attending: Physician Assistant | Admitting: Physician Assistant

## 2023-11-11 DIAGNOSIS — R072 Precordial pain: Secondary | ICD-10-CM | POA: Insufficient documentation

## 2023-11-11 MED ORDER — IOHEXOL 350 MG/ML SOLN: 100.0000 mL | Freq: Once | INTRAVENOUS | Status: DC | PRN

## 2023-11-11 MED ORDER — SODIUM CHLORIDE 0.9 % IV SOLN: Freq: Once | INTRAVENOUS | Status: AC

## 2023-11-12 ENCOUNTER — Other Ambulatory Visit: Payer: Self-pay | Admitting: Family Medicine

## 2023-11-12 DIAGNOSIS — Z79899 Other long term (current) drug therapy: Secondary | ICD-10-CM

## 2023-11-12 DIAGNOSIS — E039 Hypothyroidism, unspecified: Secondary | ICD-10-CM

## 2023-11-14 MED ORDER — ESTRADIOL 10 MCG VA TABS
1.0000 | ORAL_TABLET | VAGINAL | 0 refills | Status: AC
Start: 2023-11-14 — End: ?

## 2023-11-16 ENCOUNTER — Other Ambulatory Visit (HOSPITAL_COMMUNITY)

## 2023-11-18 ENCOUNTER — Ambulatory Visit (HOSPITAL_COMMUNITY)
Admission: RE | Admit: 2023-11-18 | Discharge: 2023-11-18 | Disposition: A | Discharge status: Home / Self Care | Source: Ambulatory Visit | Attending: Internal Medicine | Admitting: Internal Medicine

## 2023-11-18 DIAGNOSIS — I081 Rheumatic disorders of both mitral and tricuspid valves: Secondary | ICD-10-CM

## 2023-11-18 DIAGNOSIS — R072 Precordial pain: Secondary | ICD-10-CM | POA: Insufficient documentation

## 2023-11-18 LAB — ECHOCARDIOGRAM COMPLETE
Area-P 1/2: 3.43 cm2
S' Lateral: 2.15 cm

## 2023-11-19 ENCOUNTER — Ambulatory Visit: Payer: Self-pay | Admitting: Physician Assistant

## 2023-11-19 LAB — CBC
Hematocrit: 42.9 % (ref 34.0–46.6)
Hemoglobin: 14 g/dL (ref 11.1–15.9)
MCH: 30.8 pg (ref 26.6–33.0)
MCHC: 32.6 g/dL (ref 31.5–35.7)
MCV: 94 fL (ref 79–97)
Platelets: 331 x10E3/uL (ref 150–450)
RBC: 4.55 x10E6/uL (ref 3.77–5.28)
RDW: 12.9 % (ref 11.7–15.4)
WBC: 8 x10E3/uL (ref 3.4–10.8)

## 2023-11-19 LAB — BASIC METABOLIC PANEL WITH GFR
BUN/Creatinine Ratio: 29 — ABNORMAL HIGH (ref 12–28)
BUN: 20 mg/dL (ref 8–27)
CO2: 24 mmol/L (ref 20–29)
Calcium: 9.8 mg/dL (ref 8.7–10.3)
Chloride: 103 mmol/L (ref 96–106)
Creatinine, Ser: 0.7 mg/dL (ref 0.57–1.00)
Glucose: 87 mg/dL (ref 70–99)
Potassium: 4.5 mmol/L (ref 3.5–5.2)
Sodium: 140 mmol/L (ref 134–144)
eGFR: 97 mL/min/1.73 (ref >59)

## 2023-11-24 ENCOUNTER — Ambulatory Visit
Admission: RE | Admit: 2023-11-24 | Discharge: 2023-11-24 | Disposition: A | Discharge status: Home / Self Care | Source: Ambulatory Visit | Attending: Obstetrics and Gynecology | Admitting: Obstetrics and Gynecology

## 2023-11-24 DIAGNOSIS — Z1231 Encounter for screening mammogram for malignant neoplasm of breast: Secondary | ICD-10-CM

## 2023-11-28 ENCOUNTER — Telehealth (INDEPENDENT_AMBULATORY_CARE_PROVIDER_SITE_OTHER): Admitting: Family Medicine

## 2023-11-28 ENCOUNTER — Encounter: Payer: Self-pay | Admitting: Radiology

## 2023-11-28 VITALS — Ht 62.0 in | Wt 116.0 lb

## 2023-11-28 DIAGNOSIS — J0140 Acute pansinusitis, unspecified: Secondary | ICD-10-CM

## 2023-11-28 MED ORDER — AMOXICILLIN-POT CLAVULANATE 875-125 MG PO TABS: 1.0000 | ORAL_TABLET | Freq: Two times a day (BID) | ORAL | 0 refills | Status: AC

## 2023-11-28 NOTE — Progress Notes (Signed)
 Virtual Visit via Video Note  I connected with Sarah Zhang on 11/28/23 at  3:20 PM EST by a video enabled telemedicine application and verified that I am speaking with the correct person using two identifiers.  Patient Location: Home Provider Location: Office/Clinic  I discussed the limitations, risks, security, and privacy concerns of performing an evaluation and management service by video and the availability of in person appointments. I also discussed with the patient that there may be a patient responsible charge related to this service. The patient expressed understanding and agreed to proceed.  Subjective: PCP: Alvia Corean CROME, FNP  Chief Complaint  Patient presents with   Sinus Problem    Going on X 1 week, missed work : pressure, congestion, HA, sinus drainage Took sudafed   Discussed the use of AI scribe software for clinical note transcription with the patient, who gave verbal consent to proceed.  She has been experiencing nasal congestion, facial and head pressure, and yellowish nasal discharge for over a week.  She initially sought advice from a pharmacist and was recommended Sudafed, which she has been taking since Friday. She completed a previous medication, the name of which she cannot recall. Despite taking Sudafed, she continues to feel unwell and took it again this morning after skipping a dose last night.  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 She is open to taking tablets twice a day.       ROS: Per HPI  Current Outpatient Medications:    amoxicillin-clavulanate (AUGMENTIN) 875-125 MG tablet, Take 1 tablet by mouth 2 (two) times daily for 7 days., Disp: 14 tablet, Rfl: 0   Ascorbic Acid (VITAMIN C) 1000 MG tablet, Take 2,000 mg by mouth daily., Disp: , Rfl:    B Complex Vitamins (VITAMIN-B COMPLEX PO), Take 1 capsule by mouth daily., Disp: , Rfl:    Cholecalciferol (VITAMIN D  PO),  Take by mouth.  , Disp: , Rfl:    fish oil-omega-3 fatty acids 1000 MG capsule, Take 1 g by mouth daily.  , Disp: , Rfl:    levothyroxine  (SYNTHROID ) 75 MCG tablet, TAKE 1 TABLET BY MOUTH DAILY BEFORE BREAKFAST., Disp: 30 tablet, Rfl: 3   LORazepam  (ATIVAN ) 2 MG tablet, Take 1 tablet (2 mg total) by mouth at bedtime as needed for anxiety., Disp: 30 tablet, Rfl: 1   valACYclovir  (VALTREX ) 1000 MG tablet, Take 2,000 mg by mouth once., Disp: , Rfl:    vitamin E 400 UNIT capsule, Take 400 Units by mouth daily.  , Disp: , Rfl:    Estradiol  (VAGIFEM ) 10 MCG TABS vaginal tablet, Place 1 tablet (10 mcg total) vaginally 2 (two) times a week. Please keep appointment for further refills., Disp: 24 tablet, Rfl: 0  Observations/Objective: Today's Vitals   11/28/23 1451  Weight: 116 lb (52.6 kg)  Height: 5' 2 (1.575 m)   Physical Exam Constitutional:      General: She is not in acute distress.    Appearance: Normal appearance. She is not ill-appearing or toxic-appearing.  Eyes:     General: No scleral icterus.       Right eye: No discharge.        Left eye: No discharge.     Conjunctiva/sclera: Conjunctivae normal.  Pulmonary:     Effort: Pulmonary effort is normal. No respiratory distress.  Neurological:     Mental Status: She is alert and oriented to person, place, and time.  Psychiatric:  Mood and Affect: Mood normal.        Behavior: Behavior normal.        Thought Content: Thought content normal.        Judgment: Judgment normal.     Assessment & Plan Acute non-recurrent pansinusitis - Education provided on sinus infections - Advised Tylenol Cold for symptom relief. - Discontinued Sudafed if using Tylenol Cold.  Orders:   amoxicillin-clavulanate (AUGMENTIN) 875-125 MG tablet; Take 1 tablet by mouth 2 (two) times daily for 7 days.   Follow Up Instructions: Return if symptoms worsen or fail to improve.   I discussed the assessment and treatment plan with the patient. The  patient was provided an opportunity to ask questions, and all were answered. The patient agreed with the plan and demonstrated an understanding of the instructions.   The patient was advised to call back or seek an in-person evaluation if the symptoms worsen or if the condition fails to improve as anticipated.  The above assessment and management plan was discussed with the patient. The patient verbalized understanding of and has agreed to the management plan.   Niki JULIANNA Rung, FNP

## 2023-11-29 ENCOUNTER — Ambulatory Visit: Payer: Self-pay | Admitting: Obstetrics and Gynecology

## 2023-12-24 ENCOUNTER — Other Ambulatory Visit: Payer: Self-pay | Admitting: Family Medicine

## 2023-12-24 DIAGNOSIS — Z79899 Other long term (current) drug therapy: Secondary | ICD-10-CM

## 2023-12-26 ENCOUNTER — Encounter (HOSPITAL_COMMUNITY): Payer: Self-pay

## 2023-12-27 DIAGNOSIS — F411 Generalized anxiety disorder: Secondary | ICD-10-CM | POA: Diagnosis not present

## 2024-01-10 DIAGNOSIS — F411 Generalized anxiety disorder: Secondary | ICD-10-CM | POA: Diagnosis not present

## 2024-01-17 ENCOUNTER — Telehealth: Payer: Self-pay

## 2024-01-17 NOTE — Telephone Encounter (Signed)
 Copied from CRM #[id number]. Topic: Clinical - Medication Question >> Jan 17, 2024 12:17 PM Deleta S wrote: Reason for CRM: patient daughter is calling to see if provider can call in some medication for the flu patient did not take a test, but her grandson tested positive and she does not want to get the flu or spread it. Please contact patient daughter claressa buss for any concerns. Patient is going out of town in 4 hours and will need the prescription before then.

## 2024-01-18 NOTE — Telephone Encounter (Signed)
LVM for patients daughter

## 2024-02-27 ENCOUNTER — Ambulatory Visit: Payer: Self-pay | Admitting: Obstetrics and Gynecology

## 2024-04-05 ENCOUNTER — Ambulatory Visit: Admitting: Obstetrics and Gynecology
